# Patient Record
Sex: Male | Born: 1970
Health system: Southern US, Community
[De-identification: ages and names within clinical notes are randomized; demographics above are authoritative.]

## PROBLEM LIST (undated history)

## (undated) DIAGNOSIS — R918 Other nonspecific abnormal finding of lung field: Secondary | ICD-10-CM

## (undated) DIAGNOSIS — N200 Calculus of kidney: Secondary | ICD-10-CM

## (undated) DIAGNOSIS — K4020 Bilateral inguinal hernia, without obstruction or gangrene, not specified as recurrent: Secondary | ICD-10-CM

## (undated) DIAGNOSIS — N189 Chronic kidney disease, unspecified: Secondary | ICD-10-CM

## (undated) DIAGNOSIS — G43909 Migraine, unspecified, not intractable, without status migrainosus: Secondary | ICD-10-CM

## (undated) DIAGNOSIS — F17200 Nicotine dependence, unspecified, uncomplicated: Secondary | ICD-10-CM

## (undated) DIAGNOSIS — E782 Mixed hyperlipidemia: Secondary | ICD-10-CM

## (undated) HISTORY — DX: Bilateral inguinal hernia, without obstruction or gangrene, not specified as recurrent: K40.20

## (undated) HISTORY — DX: Migraine, unspecified, not intractable, without status migrainosus: G43.909

## (undated) HISTORY — DX: Chronic kidney disease, unspecified: N18.9

## (undated) HISTORY — PX: VASECTOMY: SHX75

## (undated) HISTORY — DX: Nicotine dependence, unspecified, uncomplicated: F17.200

## (undated) HISTORY — DX: Mixed hyperlipidemia: E78.2

## (undated) HISTORY — DX: Other nonspecific abnormal finding of lung field: R91.8

---

## 2013-10-23 ENCOUNTER — Encounter: Payer: Self-pay | Admitting: Urology

## 2016-07-12 ENCOUNTER — Emergency Department: Payer: BLUE CROSS/BLUE SHIELD

## 2016-07-12 ENCOUNTER — Emergency Department
Admission: EM | Admit: 2016-07-12 | Discharge: 2016-07-12 | Disposition: A | Discharge status: Home / Self Care | Payer: BLUE CROSS/BLUE SHIELD | Attending: Emergency Medicine | Admitting: Emergency Medicine

## 2016-07-12 ENCOUNTER — Encounter: Payer: Self-pay | Admitting: Emergency Medicine

## 2016-07-12 DIAGNOSIS — Z79899 Other long term (current) drug therapy: Secondary | ICD-10-CM | POA: Diagnosis not present

## 2016-07-12 DIAGNOSIS — M545 Low back pain, unspecified: Secondary | ICD-10-CM

## 2016-07-12 DIAGNOSIS — Z7982 Long term (current) use of aspirin: Secondary | ICD-10-CM | POA: Insufficient documentation

## 2016-07-12 DIAGNOSIS — F17200 Nicotine dependence, unspecified, uncomplicated: Secondary | ICD-10-CM | POA: Insufficient documentation

## 2016-07-12 DIAGNOSIS — R109 Unspecified abdominal pain: Secondary | ICD-10-CM | POA: Insufficient documentation

## 2016-07-12 HISTORY — DX: Calculus of kidney: N20.0

## 2016-07-12 LAB — URINALYSIS, ROUTINE W REFLEX MICROSCOPIC
Bacteria, UA: NONE SEEN
Bilirubin Urine: NEGATIVE
Glucose, UA: NEGATIVE mg/dL
Ketones, ur: NEGATIVE mg/dL
Leukocytes, UA: NEGATIVE
Nitrite: NEGATIVE
Protein, ur: NEGATIVE mg/dL
Specific Gravity, Urine: 1.014 (ref 1.005–1.030)
Squamous Epithelial / LPF: NONE SEEN
pH: 5 (ref 5.0–8.0)

## 2016-07-12 MED ORDER — KETOROLAC TROMETHAMINE 30 MG/ML IJ SOLN
15.0000 mg | Freq: Once | INTRAMUSCULAR | Status: AC
  Administered 2016-07-12: 15 mg via INTRAVENOUS
  Filled 2016-07-12: qty 1

## 2016-07-12 MED ORDER — HYDROCODONE-ACETAMINOPHEN 5-325 MG PO TABS: 1.0000 | ORAL_TABLET | ORAL | 0 refills | Status: DC | PRN

## 2016-07-12 MED ORDER — OXYCODONE-ACETAMINOPHEN 5-325 MG PO TABS
2.0000 | ORAL_TABLET | Freq: Once | ORAL | Status: AC
  Administered 2016-07-12: 2 via ORAL
  Filled 2016-07-12: qty 2

## 2016-07-12 MED ORDER — ONDANSETRON HCL 4 MG/2ML IJ SOLN
4.0000 mg | INTRAMUSCULAR | Status: AC
  Administered 2016-07-12: 4 mg via INTRAVENOUS
  Filled 2016-07-12: qty 2

## 2016-07-12 MED ORDER — SODIUM CHLORIDE 0.9 % IV BOLUS (SEPSIS)
1000.0000 mL | Freq: Once | INTRAVENOUS | Status: AC
  Administered 2016-07-12: 1000 mL via INTRAVENOUS

## 2016-07-12 MED ORDER — MORPHINE SULFATE (PF) 4 MG/ML IV SOLN
4.0000 mg | Freq: Once | INTRAVENOUS | Status: AC
Start: 2016-07-12 — End: 2016-07-12
  Administered 2016-07-12: 4 mg via INTRAVENOUS
  Filled 2016-07-12: qty 1

## 2016-07-12 NOTE — ED Provider Notes (Signed)
[facility] Emergency Department Provider Note  ____________________________________________   First MD Initiated Contact with Patient 07/12/16 1900     (approximate)  I have reviewed the triage vital signs and the nursing notes.   HISTORY  Chief Complaint Flank Pain    HPI Nathan Humphrey is a 46 y.o. male who reports a history of multiple prior episodes of kidney stones but none of which required surgical intervention who presents for episodic right flank pain over the last 2 weeks that has been worse over the last 24 hours.  He describes the pain as sharp and stabbing as well as a dull ache and feels similar to prior kidney stone episodes.  He denies dysuria and hematuria.  He has had some tick exposures recently but has had no rash, fever, chills, chest pain, shortness of breath, nausea, vomiting, abdominal pain.  He had some leftover doxycycline that he has been taking but it does not seem to have made a difference in terms of his flank pain.  Of note he is also started a new job recently with that requires lifting heavy objects and in addition to the right-sided flank pain he feels bilateral lower back pain.The patient reports that twisting side to side allegedly things makes the pain worse, nothing in particular makes it better.   Past Medical History:  Diagnosis Date  . Kidney stones     There are no active problems to display for this patient.   History reviewed. No pertinent surgical history.  Prior to Admission medications   Medication Sig Start Date End Date Taking? Authorizing Provider  acetaminophen (TYLENOL) 500 MG tablet Take 500 mg by mouth every 6 (six) hours as needed.   Yes [provider]  aspirin-acetaminophen-caffeine (EXCEDRIN MIGRAINE) [phone] MG tablet Take 1 tablet by mouth every 6 (six) hours as needed for headache.   Yes [provider]  doxycycline (VIBRAMYCIN) 100 MG capsule Take 100 mg by mouth 2 (two)  times daily. 07/10/16  Yes [provider]  HYDROcodone-acetaminophen (NORCO/VICODIN) 5-325 MG tablet Take 1-2 tablets by mouth every 4 (four) hours as needed for moderate pain. 07/12/16   Loleta RoseForbach, Taylour Lietzke, MD    Allergies Patient has no known allergies.  History reviewed. No pertinent family history.  Social History Social History  Substance Use Topics  . Smoking status: Current Some Day Smoker  . Smokeless tobacco: Never Used  . Alcohol use Yes    Review of Systems Constitutional: No fever/chills Eyes: No visual changes. ENT: No sore throat. Cardiovascular: Denies chest pain. Respiratory: Denies shortness of breath. Gastrointestinal: No abdominal pain.  No nausea, no vomiting.  No diarrhea.  No constipation. Genitourinary: Negative for dysuria. Musculoskeletal: Episodic right flank pain over the last 2 weeks with more acute onset bilateral lower back pain. Integumentary: Negative for rash. Neurological: Negative for headaches, focal weakness or numbness.   ____________________________________________   PHYSICAL EXAM:  VITAL SIGNS: ED Triage Vitals  Enc Vitals Group     BP 07/12/16 1845 130/77     Pulse Rate 07/12/16 1845 99     Resp 07/12/16 1845 18     Temp 07/12/16 1917 97.7 F (36.5 C)     Temp Source 07/12/16 1917 Oral     SpO2 07/12/16 1845 98 %     Weight 07/12/16 1845 88.5 kg (195 lb)     Height 07/12/16 1845 1.778 m (5\' 10" )     Head Circumference --      Peak Flow --  Pain Score 07/12/16 1844 9     Pain Loc --      Pain Edu? --      Excl. in GC? --     Constitutional: Alert and oriented. Well appearing and in no acute distress. Eyes: Conjunctivae are normal.  Head: Atraumatic. Nose: No congestion/rhinnorhea. Mouth/Throat: Mucous membranes are moist. Neck: No stridor.  No meningeal signs.   Cardiovascular: Normal rate, regular rhythm. Good peripheral circulation. Grossly normal heart sounds. Respiratory: Normal respiratory effort.  No  retractions. Lungs CTAB. Gastrointestinal: Soft and nontender. No distention.  Musculoskeletal: No specific CVA tenderness on either side but the patient has diffuse mild lower back tenderness to palpation of the muscles of his lower back.  No bony spinal tenderness to palpation.  No lower extremity tenderness nor edema. No gross deformities of extremities. Neurologic:  Normal speech and language. No gross focal neurologic deficits are appreciated.  Skin:  Skin is warm, dry and intact. No rash noted except for extensive recent sun exposure on his shoulders and upper back and he and his wife confirm they have been outside enjoying the weather recently. Psychiatric: Mood and affect are normal. Speech and behavior are normal.  ____________________________________________   LABS (all labs ordered are listed, but only abnormal results are displayed)  Labs Reviewed  URINALYSIS, ROUTINE W REFLEX MICROSCOPIC - Abnormal; Notable for the following:       Result Value   Color, Urine YELLOW (*)    APPearance CLEAR (*)    Hgb urine dipstick SMALL (*)    All other components within normal limits   ____________________________________________  EKG  None - EKG not ordered by ED physician ____________________________________________  RADIOLOGY   Ct Renal Stone Study  Result Date: 07/12/2016 CLINICAL DATA:  Patient with right flank pain for 2 weeks. EXAM: CT ABDOMEN AND PELVIS WITHOUT CONTRAST TECHNIQUE: Multidetector CT imaging of the abdomen and pelvis was performed following the standard protocol without IV contrast. COMPARISON:  CT abdomen pelvis 11/15/2013 FINDINGS: Lower chest: Normal heart size. Dependent atelectasis within the bilateral lower lobes. Stable 3 mm subpleural left lower lobe nodule (image 14; series 4) dating back to 11/15/2013, compatible with benign process. Additionally there is a 5 mm nodule within the lingula (image 4; series 4) and additional 5 mm nodule in the lingula (image  9; series 4). No pleural effusion. Hepatobiliary: Liver is diffusely low in attenuation compatible with steatosis. Gallbladder is unremarkable. Pancreas: Unremarkable Spleen: Unremarkable Adrenals/Urinary Tract: The adrenal glands are normal. Kidneys are symmetric in size. No hydronephrosis. 2 mm stone superior pole left kidney. 3 mm stone inferior pole left kidney. Scattered 2- 3 mm stones throughout the right kidney. No ureterolithiasis. Stomach/Bowel: No abnormal bowel wall thickening or evidence for bowel obstruction. Normal appendix. No free fluid or free intraperitoneal air. Normal morphology of the stomach. Small hiatal hernia. Vascular/Lymphatic: Normal caliber abdominal aorta. Peripheral calcified atherosclerotic plaque. No retroperitoneal lymphadenopathy. Reproductive: Central dystrophic calcifications in the prostate. Other: Bilateral fat containing inguinal hernias, right greater than left. Musculoskeletal: Lumbar spine degenerative changes. No aggressive or acute appearing osseous lesions. IMPRESSION: Bilateral nephrolithiasis.  No ureterolithiasis.  No hydronephrosis. Hepatic steatosis. No acute process within the abdomen or pelvis. Two adjacent 5 mm nodules within the lingula. No follow-up needed if patient is low-risk (and has no known or suspected primary neoplasm). Non-contrast chest CT can be considered in 12 months if patient is high-risk. This recommendation follows the consensus statement: Guidelines for Management of Incidental Pulmonary Nodules Detected on CT  Images: From the Fleischner Society 2017; Radiology 2017; [phone]. Electronically Signed   By: Annia Belt M.D.   On: 07/12/2016 19:46    ____________________________________________   PROCEDURES  Critical Care performed: No   Procedure(s) performed:   Procedures   ____________________________________________   INITIAL IMPRESSION / ASSESSMENT AND PLAN / ED COURSE  Pertinent labs & imaging results that were  available during my care of the patient were reviewed by me and considered in my medical decision making (see chart for details).  The patient is well-appearing and in no acute distress in spite of his report of persistent flank pain.  He has some hemoglobin in his urine but no obvious red cells and no sign of infection.  I obtained a renal stone protocol CT scan which demonstrated some nephrolithiasis but no ureterolithiasis or other evidence of acute or emergent medical condition.  We had a lengthy discussion about the possibility he has passed a stone which could be leading to some persistent discomfort or that his pain is simply the result of musculoskeletal strain from his new job.I reviewed the patient's prescription history over the last 12 months in the multi-state controlled substances database(s) that includes [place], [place], [place], [place], [place], [place], [place], [place], [place], [place], [place], [place], [place], and [place].  The patient has filled no controlled substances during that time except for a prescription for Hydromet syrup prescribed about 10 months ago.  I will give him a short course of Norco and encouraged him to take an NSAID and gave my usual and customary management recommendations and return precautions.      ____________________________________________  FINAL CLINICAL IMPRESSION(S) / ED DIAGNOSES  Final diagnoses:  Right flank pain  Acute bilateral low back pain without sciatica     MEDICATIONS GIVEN DURING THIS VISIT:  Medications  ketorolac (TORADOL) 30 MG/ML injection 15 mg (15 mg Intravenous Given 07/12/16 1908)  morphine 4 MG/ML injection 4 mg (4 mg Intravenous Given 07/12/16 1908)  ondansetron (ZOFRAN) injection 4 mg (4 mg Intravenous Given 07/12/16 1908)  sodium chloride 0.9 % bolus 1,000 mL (0 mLs Intravenous Stopped 07/12/16 2021)  oxyCODONE-acetaminophen (PERCOCET/ROXICET) 5-325 MG per tablet 2 tablet (2  tablets Oral Given 07/12/16 2021)     NEW OUTPATIENT MEDICATIONS STARTED DURING THIS VISIT:  Discharge Medication List as of 07/12/2016  8:14 PM    START taking these medications   Details  HYDROcodone-acetaminophen (NORCO/VICODIN) 5-325 MG tablet Take 1-2 tablets by mouth every 4 (four) hours as needed for moderate pain., Starting Sun 07/12/2016, Print        Discharge Medication List as of 07/12/2016  8:14 PM      Discharge Medication List as of 07/12/2016  8:14 PM       Note:  This document was prepared using Dragon voice recognition software and may include unintentional dictation errors.    Loleta Rose, MD 07/12/16 2043

## 2016-07-12 NOTE — ED Triage Notes (Signed)
Right flank pain x 2 weeks worse over the past day. C/o some dysuria earlier. Unable to sit still due to pain. Hx of kidney stones.

## 2016-07-12 NOTE — ED Notes (Signed)
Pt called out for pain medicine. Per MD York CeriseForbach, MD needs to assess before more medication given.

## 2016-07-12 NOTE — Progress Notes (Signed)
Pt requested to walk to CT.

## 2016-07-12 NOTE — Discharge Instructions (Signed)
You have been seen in the Emergency Department (ED) today for pain that we believe based on your workup, is caused by kidney stones.  It is also possible that you simply have muscle strain due to your new job.  Please drink plenty of fluids.  Please make a follow up appointment with the physician(s) listed elsewhere in this documentation.  You may take pain medication as needed but ONLY as prescribed.   We also recommend that you take over-the-counter ibuprofen regularly according to label instructions over the next 5 days.  Take it with meals to minimize stomach discomfort.  Please see your doctor as soon as possible as stones may take 1-3 weeks to pass and you may require additional care or medications.  Do not drink alcohol, drive or participate in any other potentially dangerous activities while taking opiate pain medication as it may make you sleepy. Do not take this medication with any other sedating medications, either prescription or over-the-counter. If you were prescribed Percocet or Vicodin, do not take these with acetaminophen (Tylenol) as it is already contained within these medications.   Take Norco as needed for severe pain.  This medication is an opiate (or narcotic) pain medication and can be habit forming.  Use it as little as possible to achieve adequate pain control.  Do not use or use it with extreme caution if you have a history of opiate abuse or dependence.  If you are on a pain contract with your primary care doctor or a pain specialist, be sure to let them know you were prescribed this medication today from the [facility].  This medication is intended for your use only - do not give any to anyone else and keep it in a secure place where nobody else, especially children, have access to it.  It will also cause or worsen constipation, so you may want to consider taking an over-the-counter stool softener while you are taking this medication.  Return to the  Emergency Department (ED) or call your doctor if you have any worsening pain, fever, painful urination, are unable to urinate, or develop other symptoms that concern you.

## 2016-07-17 DIAGNOSIS — R1084 Generalized abdominal pain: Secondary | ICD-10-CM | POA: Diagnosis not present

## 2016-07-24 ENCOUNTER — Encounter: Payer: Self-pay | Admitting: Internal Medicine

## 2016-07-24 ENCOUNTER — Ambulatory Visit (INDEPENDENT_AMBULATORY_CARE_PROVIDER_SITE_OTHER): Payer: BLUE CROSS/BLUE SHIELD | Admitting: Internal Medicine

## 2016-07-24 VITALS — BP 124/78 | HR 89 | Ht 70.0 in | Wt 205.0 lb

## 2016-07-24 DIAGNOSIS — K4020 Bilateral inguinal hernia, without obstruction or gangrene, not specified as recurrent: Secondary | ICD-10-CM | POA: Diagnosis not present

## 2016-07-24 DIAGNOSIS — N200 Calculus of kidney: Secondary | ICD-10-CM | POA: Insufficient documentation

## 2016-07-24 DIAGNOSIS — F17200 Nicotine dependence, unspecified, uncomplicated: Secondary | ICD-10-CM | POA: Diagnosis not present

## 2016-07-24 DIAGNOSIS — R918 Other nonspecific abnormal finding of lung field: Secondary | ICD-10-CM | POA: Insufficient documentation

## 2016-07-24 DIAGNOSIS — K625 Hemorrhage of anus and rectum: Secondary | ICD-10-CM

## 2016-07-24 HISTORY — DX: Other nonspecific abnormal finding of lung field: R91.8

## 2016-07-24 HISTORY — DX: Hemorrhage of anus and rectum: K62.5

## 2016-07-24 HISTORY — DX: Nicotine dependence, unspecified, uncomplicated: F17.200

## 2016-07-24 HISTORY — DX: Calculus of kidney: N20.0

## 2016-07-24 HISTORY — DX: Bilateral inguinal hernia, without obstruction or gangrene, not specified as recurrent: K40.20

## 2016-07-24 NOTE — Patient Instructions (Signed)
Steps to Quit Smoking Smoking tobacco can be bad for your health. It can also affect almost every organ in your body. Smoking puts you and people around you at risk for many serious long-lasting (chronic) diseases. Quitting smoking is hard, but it is one of the best things that you can do for your health. It is never too late to quit. What are the benefits of quitting smoking? When you quit smoking, you lower your risk for getting serious diseases and conditions. They can include:  Lung cancer or lung disease.  Heart disease.  Stroke.  Heart attack.  Not being able to have children (infertility).  Weak bones (osteoporosis) and broken bones (fractures).  If you have coughing, wheezing, and shortness of breath, those symptoms may get better when you quit. You may also get sick less often. If you are pregnant, quitting smoking can help to lower your chances of having a baby of low birth weight. What can I do to help me quit smoking? Talk with your doctor about what can help you quit smoking. Some things you can do (strategies) include:  Quitting smoking totally, instead of slowly cutting back how much you smoke over a period of time.  Going to in-person counseling. You are more likely to quit if you go to many counseling sessions.  Using resources and support systems, such as: ? Online chats with a counselor. ? Phone quitlines. ? Printed self-help materials. ? Support groups or group counseling. ? Text messaging programs. ? Mobile phone apps or applications.  Taking medicines. Some of these medicines may have nicotine in them. If you are pregnant or breastfeeding, do not take any medicines to quit smoking unless your doctor says it is okay. Talk with your doctor about counseling or other things that can help you.  Talk with your doctor about using more than one strategy at the same time, such as taking medicines while you are also going to in-person counseling. This can help make  quitting easier. What things can I do to make it easier to quit? Quitting smoking might feel very hard at first, but there is a lot that you can do to make it easier. Take these steps:  Talk to your family and friends. Ask them to support and encourage you.  Call phone quitlines, reach out to support groups, or work with a counselor.  Ask people who smoke to not smoke around you.  Avoid places that make you want (trigger) to smoke, such as: ? Bars. ? Parties. ? Smoke-break areas at work.  Spend time with people who do not smoke.  Lower the stress in your life. Stress can make you want to smoke. Try these things to help your stress: ? Getting regular exercise. ? Deep-breathing exercises. ? Yoga. ? Meditating. ? Doing a body scan. To do this, close your eyes, focus on one area of your body at a time from head to toe, and notice which parts of your body are tense. Try to relax the muscles in those areas.  Download or buy apps on your mobile phone or tablet that can help you stick to your quit plan. There are many free apps, such as QuitGuide from the CDC (Centers for Disease Control and Prevention). You can find more support from smokefree.gov and other websites.  This information is not intended to replace advice given to you by your health care provider. Make sure you discuss any questions you have with your health care provider. Document Released: 11/22/2008 Document   Revised: 09/24/2015 Document Reviewed: 06/12/2014 Elsevier Interactive Patient Education  2018 Elsevier Inc.  

## 2016-07-24 NOTE — Progress Notes (Signed)
Date:  07/24/2016   Name:  Nathan Humphrey   DOB:  1970/10/19   MRN:  [id number]   Chief Complaint: [place] (New to area- From [place]. Never had PCP and needs one. ) HPI Bilateral fat containing inguinal hernias noted on CT done in ER for kidney stones. He has lower abdominal discomfort but no testicular pain.  Lung nodules - stable 3 mm nodule left lung noted on CT but 2 new 5 mm nodules seen in lingula.  Pt smokes cigars - usually 2-3 per weekend day.  No cough, shortness of breath or sputum production.  Kidney stones - seen by ED and noted to have tiny bilateral stones with no evidence of hydronephrosis.  Currently no flank pain.  He does have generalized lower abdominal discomfort.  Rectal bleeding - intermittent bleeding noted after defecation.  No pain, no constipation, no rectal mass.  No hx of colonoscopy.  Review of Systems  Constitutional: Negative for chills, fatigue and fever.  HENT: Negative for trouble swallowing.   Eyes: Negative for visual disturbance.  Respiratory: Negative for cough, chest tightness, shortness of breath and wheezing.   Cardiovascular: Negative for chest pain, palpitations and leg swelling.  Gastrointestinal: Positive for abdominal pain and blood in stool (this week). Negative for constipation, nausea, rectal pain and vomiting.  Endocrine: Negative for polydipsia.  Musculoskeletal: Negative for arthralgias.  Skin: Negative for rash.  Neurological: Negative for dizziness and headaches.  Hematological: Negative for adenopathy.  Psychiatric/Behavioral: Negative for sleep disturbance.    Patient Active Problem List   Diagnosis Date Noted  . Pulmonary nodules/lesions, multiple 07/24/2016  . Bilateral inguinal hernia without obstruction or gangrene 07/24/2016  . Kidney stones 07/24/2016  . Tobacco use disorder 07/24/2016    Prior to Admission medications   Medication Sig Start Date End Date Taking? Authorizing Provider  acetaminophen  (TYLENOL) 500 MG tablet Take 500 mg by mouth every 6 (six) hours as needed.   Yes [provider]  aspirin-acetaminophen-caffeine (EXCEDRIN MIGRAINE) [phone] MG tablet Take 1 tablet by mouth every 6 (six) hours as needed for headache.   Yes [provider]  HYDROcodone-acetaminophen (NORCO/VICODIN) 5-325 MG tablet Take 1-2 tablets by mouth every 4 (four) hours as needed for moderate pain. 07/12/16  Yes Hinda Kehr, MD    No Known Allergies  History reviewed. No pertinent surgical history.  Social History  Substance Use Topics  . Smoking status: Current Some Day Smoker    Types: Cigarettes  . Smokeless tobacco: Never Used     Comment: smokes on weekend.   . Alcohol use Yes     Comment: occasional.   Component Name  IP LAB RESULTS on 10/18/2013 [facility]")' href="[email or web address]/">10/18/2013   137  View Detailed Result Report COMP METABOLIC PANEL 10/13/5036")' href="[id number] COMP METABOLIC MBEML/">5.4  492  30  117 (H)  16  View Detailed Result Report COMP METABOLIC PANEL 0/02/69")' href="[id number] COMP METABOLIC RPRXY/">5.85 (H)  9.5  8.1  4.2  154 (H)  View Detailed Result Report COMP METABOLIC PANEL 10/12/9242")' href="[id number] COMP METABOLIC OKHTX/">77  0.8  3.9  1.1  57  >59  52 (L)  View Detailed Result Report COMP METABOLIC PANEL 05/10/4237")' href="[id number] COMP METABOLIC PANEL/"> VV/KPQ/[id number] FAILURE:")'>GFR IS ESTI...  6.1 (L)  89  Sodium(Na)  Potassium(K)  Chloride(Cl)  Carbon Dioxide(CO2)  Glucose  Urea Nitrogen  Creatinine  Calcium(Ca)  Total Protein  Albumin  Alkaline Phosphatase  Aspartate Aminot.(AST)  Total  Bilirubin  GLOBULIN  A/G RATIO  Alanine Aminotrans(ALT)  EGFR AFRICAN AMERICAN  EGFR NON AFRICAN AMERICAN  GFR COMMENT  ANION GAP  Lipase    Component Name  IP LAB RESULTS on 10/18/2013 [facility]")' href="[email or web address]/">10/18/2013   10.1  5.26  16.5  48.5  92.1  31.4  34.1  13.2  163  10.1  70.5  19.1  7.9  1.9  0.6  7.1  1.9  0.8  0.2  0.1  View Detailed Result Report PROTHROMBIN TIME/INR 10/18/2013")' href="[id number] PROTHROMBIN TIME/INR">13.4  View Detailed Result Report PROTHROMBIN TIME/INR 10/18/2013")' href="[id number] PROTHROMBIN TIME/INR">1.0  WBC  RBC  Hgb  Hematocrit(HCT)  MCV  MCH  MCHC  RDW  Platelets  Mean Platelet Volume  Neutrophils  Lymphocytes  Monocytes  Eosinophils  Basophils  ABS Neutrophils  ABS Lymphocytes  ABS Monocytes  ABS Eosinophils  ABS Basophils  Prothrombin Time  INR    Medication list has been reviewed and updated.   Physical Exam  Constitutional: He is oriented to person, place, and time. He appears well-developed. No distress.  HENT:  Head: Normocephalic and atraumatic.  Neck: Normal range of motion. Neck supple. No thyromegaly present.  Cardiovascular: Normal rate, regular rhythm and normal heart sounds.   Pulmonary/Chest: Effort normal and breath sounds normal. No respiratory distress. He has no wheezes.  Abdominal: Soft. Normal appearance and bowel sounds are normal. There is generalized tenderness. There is no rigidity, no guarding and no CVA tenderness. A hernia (tender bilateral groin) is present.  Genitourinary: Rectal exam shows external hemorrhoid (tiny hemorrhoid at 12 oclock) and tenderness. Rectal exam shows guaiac negative stool.  Musculoskeletal: Normal range of motion.  Neurological: He is alert and  oriented to person, place, and time.  Skin: Skin is warm and dry. No rash noted.  Psychiatric: He has a normal mood and affect. His speech is normal and behavior is normal. Thought content normal.  Nursing note and vitals reviewed.   BP 124/78   Pulse 89   Ht _0  (1.778 m)   Wt 205 lb (93 kg)   SpO2 98%   BMI 29.41 kg/m   Assessment and Plan: 1. Pulmonary nodules/lesions, multiple Need CT in one year Pt counseled on smoking cessation  2. Bilateral inguinal hernia without obstruction or gangrene, recurrence not specified Refer to General Surgery - Ambulatory referral to General Surgery  3. Kidney stones asx currently  4. Tobacco use disorder To work on quitting  5. Bright red rectal bleeding Guaiac negative today but likely from small hemorrhoid Return for further evaluation if persistent   No orders of the defined types were placed in this encounter.   Halina Maidens, MD [facility]  07/24/2016

## 2016-07-31 ENCOUNTER — Encounter: Payer: Self-pay | Admitting: Internal Medicine

## 2016-07-31 ENCOUNTER — Ambulatory Visit (INDEPENDENT_AMBULATORY_CARE_PROVIDER_SITE_OTHER): Payer: BLUE CROSS/BLUE SHIELD | Admitting: Internal Medicine

## 2016-07-31 VITALS — BP 118/76 | HR 82 | Ht 70.0 in | Wt 204.0 lb

## 2016-07-31 DIAGNOSIS — R918 Other nonspecific abnormal finding of lung field: Secondary | ICD-10-CM | POA: Diagnosis not present

## 2016-07-31 DIAGNOSIS — F17200 Nicotine dependence, unspecified, uncomplicated: Secondary | ICD-10-CM

## 2016-07-31 DIAGNOSIS — K4020 Bilateral inguinal hernia, without obstruction or gangrene, not specified as recurrent: Secondary | ICD-10-CM

## 2016-07-31 DIAGNOSIS — Z0000 Encounter for general adult medical examination without abnormal findings: Secondary | ICD-10-CM | POA: Diagnosis not present

## 2016-07-31 LAB — POCT URINALYSIS DIPSTICK
Bilirubin, UA: NEGATIVE
Blood, UA: NEGATIVE
Glucose, UA: NEGATIVE
Ketones, UA: NEGATIVE
Nitrite, UA: NEGATIVE
Protein, UA: NEGATIVE
Spec Grav, UA: <=1.005 — AB (ref 1.010–1.025)
Urobilinogen, UA: 0.2 E.U./dL
pH, UA: 5 (ref 5.0–8.0)

## 2016-07-31 NOTE — Progress Notes (Signed)
Date:  07/31/2016   Name:  Nathan Humphrey   DOB:  1970-09-02   MRN:  [id number]   Chief Complaint: Annual Exam Nathan Humphrey is a 46 y.o. male who presents today for his Complete Annual Exam. He feels well. He reports exercising regularly with work. He reports he is sleeping well.  He has has no further rectal bleeding - was checked last week with no obvious cause. He still has abdominal tenderness - either related to kidney stones or his bilateral inguinal hernias.  He is waiting for a General Surgery appt to discuss surgery. He is cutting back on cigars.  He mostly smokes on the weekend so does not have much work to do.   Review of Systems  Constitutional: Negative for appetite change, chills, diaphoresis, fatigue and unexpected weight change.  HENT: Negative for hearing loss, tinnitus, trouble swallowing and voice change.   Eyes: Negative for visual disturbance.  Respiratory: Negative for choking, shortness of breath and wheezing.   Cardiovascular: Negative for chest pain, palpitations and leg swelling.  Gastrointestinal: Positive for abdominal pain. Negative for blood in stool, constipation, diarrhea, rectal pain and vomiting.  Genitourinary: Negative for difficulty urinating, dysuria and frequency.  Musculoskeletal: Negative for arthralgias, back pain and myalgias.  Skin: Negative for color change and rash.  Neurological: Negative for dizziness, syncope and headaches.  Hematological: Negative for adenopathy.  Psychiatric/Behavioral: Negative for dysphoric mood and sleep disturbance.    Patient Active Problem List   Diagnosis Date Noted  . Pulmonary nodules/lesions, multiple 07/24/2016  . Bilateral inguinal hernia without obstruction or gangrene 07/24/2016  . Kidney stones 07/24/2016  . Tobacco use disorder 07/24/2016  . Bright red rectal bleeding 07/24/2016    Prior to Admission medications   Medication Sig Start Date End Date Taking? Authorizing Provider  acetaminophen  (TYLENOL) 500 MG tablet Take 500 mg by mouth every 6 (six) hours as needed.   Yes [provider]  aspirin-acetaminophen-caffeine (EXCEDRIN MIGRAINE) [phone] MG tablet Take 1 tablet by mouth every 6 (six) hours as needed for headache.   Yes [provider]  HYDROcodone-acetaminophen (NORCO/VICODIN) 5-325 MG tablet Take 1-2 tablets by mouth every 4 (four) hours as needed for moderate pain. 07/12/16  Yes Loleta Rose, MD    No Known Allergies  History reviewed. No pertinent surgical history.  Social History  Substance Use Topics  . Smoking status: Current Some Day Smoker    Types: Cigars  . Smokeless tobacco: Never Used     Comment: 2-3 cigars per day only on weekends (inhaled)  . Alcohol use Yes     Comment: occasional.     Medication list has been reviewed and updated.   Physical Exam  Constitutional: He is oriented to person, place, and time. He appears well-developed. No distress.  HENT:  Head: Normocephalic and atraumatic.  Neck: Normal range of motion. Neck supple. No thyromegaly present.  Cardiovascular: Normal rate, regular rhythm and normal heart sounds.   Pulmonary/Chest: Effort normal and breath sounds normal. No respiratory distress. He has no wheezes.  Abdominal: Soft. Normal appearance. There is generalized tenderness. There is no rigidity, no guarding and no CVA tenderness.  Musculoskeletal: Normal range of motion.  Neurological: He is alert and oriented to person, place, and time. He has normal strength and normal reflexes.  Skin: Skin is warm and dry. No rash noted.  Psychiatric: He has a normal mood and affect. His speech is normal and behavior is normal. Thought content normal.  Nursing note and vitals reviewed.   BP 134/80   Pulse 82   Ht 5\' 10"  (1.778 m)   Wt 204 lb (92.5 kg)   SpO2 99%   BMI 29.27 kg/m   Assessment and Plan: 1. Annual physical exam - Lipid panel - Comprehensive metabolic panel - CBC with  Differential/Platelet - POCT urinalysis dipstick  2. Bilateral inguinal hernia without obstruction or gangrene, recurrence not specified Waiting for General surgery referral  3. Tobacco use disorder Counseled to quit  4. Pulmonary nodules/lesions, multiple Repeat CT chest in one year   No orders of the defined types were placed in this encounter.   Bari EdwardLaura Corrinna Karapetyan, MD [facility]  07/31/2016

## 2016-07-31 NOTE — Patient Instructions (Signed)

## 2016-08-01 LAB — CBC WITH DIFFERENTIAL/PLATELET
Basophils Absolute: 0.1 10*3/uL (ref 0.0–0.2)
Basos: 1 %
EOS (ABSOLUTE): 0.2 10*3/uL (ref 0.0–0.4)
Eos: 3 %
Hematocrit: 45 % (ref 37.5–51.0)
Hemoglobin: 15.2 g/dL (ref 13.0–17.7)
Immature Grans (Abs): 0 10*3/uL (ref 0.0–0.1)
Immature Granulocytes: 0 %
Lymphocytes Absolute: 2.7 10*3/uL (ref 0.7–3.1)
Lymphs: 34 %
MCH: 31.5 pg (ref 26.6–33.0)
MCHC: 33.8 g/dL (ref 31.5–35.7)
MCV: 93 fL (ref 79–97)
Monocytes Absolute: 0.6 10*3/uL (ref 0.1–0.9)
Monocytes: 7 %
Neutrophils Absolute: 4.5 10*3/uL (ref 1.4–7.0)
Neutrophils: 55 %
Platelets: 184 10*3/uL (ref 150–379)
RBC: 4.82 x10E6/uL (ref 4.14–5.80)
RDW: 13.8 % (ref 12.3–15.4)
WBC: 8.1 10*3/uL (ref 3.4–10.8)

## 2016-08-01 LAB — COMPREHENSIVE METABOLIC PANEL
ALT: 38 IU/L (ref 0–44)
AST: 27 IU/L (ref 0–40)
Albumin/Globulin Ratio: 1.8 (ref 1.2–2.2)
Albumin: 4.4 g/dL (ref 3.5–5.5)
Alkaline Phosphatase: 118 IU/L — ABNORMAL HIGH (ref 39–117)
BUN/Creatinine Ratio: 11 (ref 9–20)
BUN: 12 mg/dL (ref 6–24)
Bilirubin Total: 0.5 mg/dL (ref 0.0–1.2)
CO2: 25 mmol/L (ref 20–29)
Calcium: 9.8 mg/dL (ref 8.7–10.2)
Chloride: 103 mmol/L (ref 96–106)
Creatinine, Ser: 1.1 mg/dL (ref 0.76–1.27)
GFR calc Af Amer: 95 mL/min/{1.73_m2} (ref >59)
GFR calc non Af Amer: 82 mL/min/{1.73_m2} (ref >59)
Globulin, Total: 2.4 g/dL (ref 1.5–4.5)
Glucose: 97 mg/dL (ref 65–99)
Potassium: 4.4 mmol/L (ref 3.5–5.2)
Sodium: 144 mmol/L (ref 134–144)
Total Protein: 6.8 g/dL (ref 6.0–8.5)

## 2016-08-01 LAB — LIPID PANEL
Chol/HDL Ratio: 5.4 ratio — ABNORMAL HIGH (ref 0.0–5.0)
Cholesterol, Total: 210 mg/dL — ABNORMAL HIGH (ref 100–199)
HDL: 39 mg/dL — ABNORMAL LOW (ref >39)
LDL Calculated: 139 mg/dL — ABNORMAL HIGH (ref 0–99)
Triglycerides: 162 mg/dL — ABNORMAL HIGH (ref 0–149)
VLDL Cholesterol Cal: 32 mg/dL (ref 5–40)

## 2016-08-03 ENCOUNTER — Encounter: Payer: Self-pay | Admitting: Internal Medicine

## 2016-08-03 ENCOUNTER — Telehealth: Payer: Self-pay | Admitting: Surgery

## 2016-08-03 DIAGNOSIS — E782 Mixed hyperlipidemia: Secondary | ICD-10-CM | POA: Insufficient documentation

## 2016-08-03 HISTORY — DX: Mixed hyperlipidemia: E78.2

## 2016-08-03 NOTE — Telephone Encounter (Signed)
I have called patient to make an appointment with surgeon per referral request from Dr Asencion PartridgeBergland. Inguinal Hernia. No answer. I have left a detailed message for patient to call and make appointment.

## 2016-08-03 NOTE — Progress Notes (Signed)
Informed pt of cholesterol being borderline elevated. Told medication is recommended. Patient stated he wants to think about it and will call back in a few days to let us know what he decides.

## 2016-08-04 NOTE — Telephone Encounter (Signed)
Appointment has been made with Dr Aleen CampiPiscoya.

## 2016-08-13 ENCOUNTER — Encounter: Payer: Self-pay | Admitting: Surgery

## 2016-08-13 ENCOUNTER — Ambulatory Visit (INDEPENDENT_AMBULATORY_CARE_PROVIDER_SITE_OTHER): Payer: BLUE CROSS/BLUE SHIELD | Admitting: Surgery

## 2016-08-13 VITALS — BP 133/88 | HR 106 | Temp 98.6°F | Ht 70.0 in | Wt 203.0 lb

## 2016-08-13 DIAGNOSIS — K4020 Bilateral inguinal hernia, without obstruction or gangrene, not specified as recurrent: Secondary | ICD-10-CM | POA: Diagnosis not present

## 2016-08-13 NOTE — Patient Instructions (Signed)
Angie will call you with the cost of your surgery.   Please let us know if you develop any symptoms or if you choose to have your surgery.

## 2016-08-14 NOTE — Progress Notes (Signed)
08/14/2016  Reason for Visit:  Bilateral inguinal hernias  History of Present Illness: Nathan Humphrey is a 46 y.o. male referred by his PCP for evaluation of bilateral inguinal hernias.  The patient has a history of kidney stones and was in the emergency room on 07/12/16 with right flank pain.  CT scan showed that he had small bilateral inguinal hernias containing fat.    The patient denies any significant groin symptoms.  Reports more discomfort in the mid abdomen on either side rather than low abdomen and groin area.  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  Denies any nausea, vomiting, changes in bowel habits, blood in the stool.  Reports he has not really noticed any bulging on either side either.   Past Medical History: Past Medical History:  Diagnosis Date  . Bilateral inguinal hernia   . Bilateral inguinal hernia without obstruction or gangrene 07/24/2016  . Chronic kidney disease    kidney stones  . Hyperlipidemia, mixed 08/03/2016   10 yr risk 7.5 %  . Kidney stones 07/24/2016   Has had several episodes of stones  . Migraines   . Pulmonary nodules/lesions, multiple 07/24/2016   CT scan in one year to follow up 2 new nodules in Lingula (5 mm in size)  . Tobacco use disorder 07/24/2016     Past Surgical History: --None  Home Medications: Prior to Admission medications   Medication Sig Start Date End Date Taking? Authorizing Provider  acetaminophen (TYLENOL) 500 MG tablet Take 500 mg by mouth every 6 (six) hours as needed.   Yes [provider]  aspirin-acetaminophen-caffeine (EXCEDRIN MIGRAINE) [phone] MG tablet Take 1 tablet by mouth every 6 (six) hours as needed for headache.   Yes [provider]  HYDROcodone-acetaminophen (NORCO/VICODIN) 5-325 MG tablet Take 1-2 tablets by mouth every 4 (four) hours as needed for moderate pain. 07/12/16  Yes Loleta RoseForbach, Cory,  MD    Allergies: No Known Allergies  Social History:  reports that he has been smoking Cigars.  He has never used smokeless tobacco. He reports that he drinks alcohol. He reports that he does not use drugs.   Family History: Family History  Problem Relation Age of Onset  . CAD Mother 5261    Review of Systems: Review of Systems  Constitutional: Negative for chills and fever.  HENT: Negative for hearing loss.   Eyes: Negative for blurred vision.  Respiratory: Negative for cough.   Cardiovascular: Negative for chest pain.  Gastrointestinal: Negative for abdominal pain, blood in stool, nausea and vomiting.  Genitourinary: Positive for flank pain.  Musculoskeletal: Negative for myalgias.  Skin: Negative for rash.  Neurological: Negative for dizziness.  Psychiatric/Behavioral: Negative for depression.  All other systems reviewed and are negative.   Physical Exam BP 133/88   Pulse (!) 106   Temp 98.6 F (37 C) (Oral)   Ht 5\' 10"  (1.778 m)   Wt 92.1 kg (203 lb)   BMI 29.13 kg/m  CONSTITUTIONAL: No acute distress HEENT:  Normocephalic, atraumatic, extraocular motion intact. NECK: Trachea is midline, and there is no jugular venous distension.  RESPIRATORY:  Lungs are clear, and breath sounds are equal bilaterally. Normal respiratory effort without pathologic use of accessory muscles. CARDIOVASCULAR: Heart is regular without murmurs, gallops, or rubs. GI: The abdomen is soft, nondistended, nontender to palpation.  Mild swelling of bilateral groin area, without any  significant tenderness on exam.  Unable to fully palpate hernia defects, with no significant bulging with coughing. MUSCULOSKELETAL:  Normal muscle strength and tone in all four extremities.  No peripheral edema or cyanosis. SKIN: Skin turgor is normal. There are no pathologic skin lesions.  NEUROLOGIC:  Motor and sensation is grossly normal.  Cranial nerves are grossly intact. PSYCH:  Alert and oriented to person,  place and time. Affect is normal.  Laboratory Analysis: No results found for this or any previous visit (from the past 24 hour(s)).  Imaging: CT 07/12/16:  Bilateral fat containing inguinal hernias, right greater than left.   Assessment and Plan: This is a 46 y.o. male who presents with what appears to be asymptomatic bilateral inguinal hernias.  I have independently viewed the patient's imaging study and discussed it with the patient.  His CT scan shows small bilateral inguinal hernias, with fat only.  No evidence of bowel obstruction.  Discussed with the patient that there is no urgency to having any surgical repair of his hernias, given that he cannot really pinpoint any specific symptoms that he has had related to the hernias and he was not even aware of them until the CT scan was done.  For the meantime, we will plan for conservative management with watchful waiting.  Discussed with the patient possible symptoms to look out for and also discussed the array of inguinal hernias from reducible to strangulated.  The patient does think he'd be interested in surgery in the future, and will look into finances to better plan for this as needed.  He will call us with any questions or concerns and for possible surgical planning.  Face-to-face time spent with the patient and care providers was 45 minutes, with more than 50% of the time spent counseling, educating, and coordinating care of the patient.     Howie Ill, MD [facility]

## 2016-08-18 ENCOUNTER — Telehealth: Payer: Self-pay

## 2016-08-18 NOTE — Telephone Encounter (Signed)
-----   Message from Nicole KindredAngela K Brouillard sent at 08/14/2016 11:44 AM EDT ----- Regarding: RE: Surgery question I have called patient and discussed estimate. Pt will call back once he decided to schudule.  ----- Message ----- From: Adela PortsBoniche, Lucas Exline, CMA Sent: 08/13/2016   4:25 PM To: Karna ChristmasAngela K Brouillard Subject: Surgery question                               Please call patient with the cost of his surgery. With this info. He will determine if and when he will have it done.  It's Dr. Adelene IdlerPiscoya's patient.

## 2016-08-18 NOTE — Telephone Encounter (Signed)
Patient will call us back once he decides when to have his surgery.

## 2017-01-26 ENCOUNTER — Encounter: Payer: BLUE CROSS/BLUE SHIELD | Admitting: Internal Medicine

## 2017-01-29 ENCOUNTER — Encounter: Payer: BLUE CROSS/BLUE SHIELD | Admitting: Internal Medicine

## 2018-07-15 ENCOUNTER — Emergency Department
Admission: EM | Admit: 2018-07-15 | Discharge: 2018-07-15 | Disposition: A | Discharge status: Home / Self Care | Payer: 59 | Attending: Emergency Medicine | Admitting: Emergency Medicine

## 2018-07-15 ENCOUNTER — Emergency Department: Payer: 59

## 2018-07-15 DIAGNOSIS — N189 Chronic kidney disease, unspecified: Secondary | ICD-10-CM | POA: Insufficient documentation

## 2018-07-15 DIAGNOSIS — N130 Hydronephrosis with ureteropelvic junction obstruction: Secondary | ICD-10-CM | POA: Insufficient documentation

## 2018-07-15 DIAGNOSIS — F17290 Nicotine dependence, other tobacco product, uncomplicated: Secondary | ICD-10-CM | POA: Diagnosis not present

## 2018-07-15 DIAGNOSIS — R109 Unspecified abdominal pain: Secondary | ICD-10-CM | POA: Diagnosis present

## 2018-07-15 DIAGNOSIS — N23 Unspecified renal colic: Secondary | ICD-10-CM

## 2018-07-15 LAB — URINALYSIS, COMPLETE (UACMP) WITH MICROSCOPIC
RBC / HPF: >50 RBC/hpf — ABNORMAL HIGH (ref 0–5)
Specific Gravity, Urine: 1.032 — ABNORMAL HIGH (ref 1.005–1.030)
WBC, UA: >50 WBC/hpf — ABNORMAL HIGH (ref 0–5)

## 2018-07-15 LAB — CBC
HCT: 46.7 % (ref 39.0–52.0)
Hemoglobin: 15.8 g/dL (ref 13.0–17.0)
MCH: 31.3 pg (ref 26.0–34.0)
MCHC: 33.8 g/dL (ref 30.0–36.0)
MCV: 92.7 fL (ref 80.0–100.0)
Platelets: 208 10*3/uL (ref 150–400)
RBC: 5.04 MIL/uL (ref 4.22–5.81)
RDW: 12.7 % (ref 11.5–15.5)
WBC: 10.6 10*3/uL — ABNORMAL HIGH (ref 4.0–10.5)
nRBC: 0 % (ref 0.0–0.2)

## 2018-07-15 LAB — BASIC METABOLIC PANEL
Anion gap: 7 (ref 5–15)
BUN: 17 mg/dL (ref 6–20)
CO2: 28 mmol/L (ref 22–32)
Calcium: 9.3 mg/dL (ref 8.9–10.3)
Chloride: 107 mmol/L (ref 98–111)
Creatinine, Ser: 1.2 mg/dL (ref 0.61–1.24)
GFR calc Af Amer: >60 mL/min (ref >60)
GFR calc non Af Amer: >60 mL/min (ref >60)
Glucose, Bld: 104 mg/dL — ABNORMAL HIGH (ref 70–99)
Potassium: 4.2 mmol/L (ref 3.5–5.1)
Sodium: 142 mmol/L (ref 135–145)

## 2018-07-15 MED ORDER — SODIUM CHLORIDE 0.9 % IV SOLN
1.0000 g | Freq: Once | INTRAVENOUS | Status: AC
  Administered 2018-07-15: 1 g via INTRAVENOUS
  Filled 2018-07-15: qty 10

## 2018-07-15 MED ORDER — MORPHINE SULFATE (PF) 4 MG/ML IV SOLN
4.0000 mg | Freq: Once | INTRAVENOUS | Status: AC
  Administered 2018-07-15: 4 mg via INTRAVENOUS

## 2018-07-15 MED ORDER — TAMSULOSIN HCL 0.4 MG PO CAPS: 0.4000 mg | ORAL_CAPSULE | Freq: Every day | ORAL | 0 refills | Status: DC

## 2018-07-15 MED ORDER — ONDANSETRON 4 MG PO TBDP: 4.0000 mg | ORAL_TABLET | Freq: Three times a day (TID) | ORAL | 0 refills | Status: DC | PRN

## 2018-07-15 MED ORDER — CEPHALEXIN 500 MG PO CAPS
500.0000 mg | ORAL_CAPSULE | Freq: Four times a day (QID) | ORAL | 0 refills | Status: AC
Start: 2018-07-15 — End: 2018-07-25

## 2018-07-15 MED ORDER — KETOROLAC TROMETHAMINE 30 MG/ML IJ SOLN
30.0000 mg | Freq: Once | INTRAMUSCULAR | Status: AC
  Administered 2018-07-15: 30 mg via INTRAVENOUS
  Filled 2018-07-15: qty 1

## 2018-07-15 MED ORDER — ONDANSETRON HCL 4 MG/2ML IJ SOLN
INTRAMUSCULAR | Status: AC
  Administered 2018-07-15: 4 mg via INTRAVENOUS
  Filled 2018-07-15: qty 2

## 2018-07-15 MED ORDER — ONDANSETRON HCL 4 MG/2ML IJ SOLN
4.0000 mg | Freq: Once | INTRAMUSCULAR | Status: AC
  Administered 2018-07-15: 4 mg via INTRAVENOUS

## 2018-07-15 MED ORDER — OXYCODONE-ACETAMINOPHEN 5-325 MG PO TABS: 1.0000 | ORAL_TABLET | Freq: Three times a day (TID) | ORAL | 0 refills | Status: DC | PRN

## 2018-07-15 MED ORDER — OXYCODONE-ACETAMINOPHEN 5-325 MG PO TABS
2.0000 | ORAL_TABLET | Freq: Once | ORAL | Status: AC
  Administered 2018-07-15: 2 via ORAL
  Filled 2018-07-15: qty 2

## 2018-07-15 MED ORDER — MORPHINE SULFATE (PF) 4 MG/ML IV SOLN
INTRAVENOUS | Status: AC
  Administered 2018-07-15: 4 mg via INTRAVENOUS
  Filled 2018-07-15: qty 1

## 2018-07-15 NOTE — ED Provider Notes (Signed)
[facility] Emergency Department Provider Note       Time seen: ----------------------------------------- 7:46 AM on 07/15/2018 -----------------------------------------   I have reviewed the triage vital signs and the nursing notes.  HISTORY   Chief Complaint Flank Pain    HPI Nathan Humphrey is a 48 y.o. male with a history of bilateral inguinal hernia, kidney stones, chronic kidney disease, hyperlipidemia, migraines who presents to the ED for right flank pain that began immediately prior to arrival.  Pain is 9 out of 10, acutely in the right flank.  It is constant.  Past Medical History:  Diagnosis Date  . Bilateral inguinal hernia   . Bilateral inguinal hernia without obstruction or gangrene 07/24/2016  . Chronic kidney disease    kidney stones  . Hyperlipidemia, mixed 08/03/2016   10 yr risk 7.5 %  . Kidney stones 07/24/2016   Has had several episodes of stones  . Migraines   . Pulmonary nodules/lesions, multiple 07/24/2016   CT scan in one year to follow up 2 new nodules in Lingula (5 mm in size)  . Tobacco use disorder 07/24/2016    Patient Active Problem List   Diagnosis Date Noted  . Hyperlipidemia, mixed 08/03/2016  . Pulmonary nodules/lesions, multiple 07/24/2016  . Bilateral inguinal hernia without obstruction or gangrene 07/24/2016  . Kidney stones 07/24/2016  . Tobacco use disorder 07/24/2016  . Bright red rectal bleeding 07/24/2016    History reviewed. No pertinent surgical history.  Allergies Patient has no known allergies.  Social History Social History   Tobacco Use  . Smoking status: Current Some Day Smoker    Types: Cigars  . Smokeless tobacco: Never Used  . Tobacco comment: 2-3 cigars per day only on weekends (inhaled)  Substance Use Topics  . Alcohol use: Yes    Comment: occasional.  . Drug use: No   Review of Systems Constitutional: Negative for fever. Cardiovascular: Negative for chest pain. Respiratory:  Negative for shortness of breath. Gastrointestinal: Positive for flank pain Musculoskeletal: Negative for back pain. Skin: Negative for rash. Neurological: Negative for headaches, focal weakness or numbness.  All systems negative/normal/unremarkable except as stated in the HPI  ____________________________________________   PHYSICAL EXAM:  VITAL SIGNS: ED Triage Vitals  Enc Vitals Group     BP 07/15/18 0617 126/84     Pulse Rate 07/15/18 0617 66     Resp 07/15/18 0617 20     Temp 07/15/18 0623 97.6 F (36.4 C)     Temp Source 07/15/18 0617 Oral     SpO2 07/15/18 0617 99 %     Weight 07/15/18 0611 200 lb (90.7 kg)     Height 07/15/18 0611  (1.778 m)     Head Circumference --      Peak Flow --      Pain Score 07/15/18 0611 9     Pain Loc --      Pain Edu? --      Excl. in GC? --    Constitutional: Alert and oriented. Well appearing and in no distress. Eyes: Conjunctivae are normal. Normal extraocular movements. ENT      Head: Normocephalic and atraumatic.      Nose: No congestion/rhinnorhea.      Mouth/Throat: Mucous membranes are moist.      Neck: No stridor. Cardiovascular: Normal rate, regular rhythm. No murmurs, rubs, or gallops. Respiratory: Normal respiratory effort without tachypnea nor retractions. Breath sounds are clear and equal bilaterally. No wheezes/rales/rhonchi. Gastrointestinal: Right flank tenderness,  no rebound or guarding.  Normal bowel sounds. Musculoskeletal: Nontender with normal range of motion in extremities. No lower extremity tenderness nor edema. Neurologic:  Normal speech and language. No gross focal neurologic deficits are appreciated.  Skin:  Skin is warm, dry and intact. No rash noted. Psychiatric: Mood and affect are normal. Speech and behavior are normal.  ____________________________________________  ED COURSE:  As part of my medical decision making, I reviewed the following data within the electronic MEDICAL RECORD NUMBER History  obtained from family if available, nursing notes, old chart and ekg, as well as notes from prior ED visits. Patient presented for right flank pain, we will assess with labs and imaging as indicated at this time. Clinical Course as of Jul 14 1105  Fri Jul 15, 2018  0902 CT reveals small proximal right ureteral stone   [JW]    Clinical Course User Index [JW] Emily FilbertWilliams, Jonathan E, MD   Procedures  Valora PiccoloRyan J Souffrant was evaluated in Emergency Department on 07/15/2018 for the symptoms described in the history of present illness. He was evaluated in the context of the global COVID-19 pandemic, which necessitated consideration that the patient might be at risk for infection with the SARS-CoV-2 virus that causes COVID-19. Institutional protocols and algorithms that pertain to the evaluation of patients at risk for COVID-19 are in a state of rapid change based on information released by regulatory bodies including the CDC and federal and state organizations. These policies and algorithms were followed during the patient's care in the ED.  ____________________________________________   LABS (pertinent positives/negatives)  Labs Reviewed  URINALYSIS, COMPLETE (UACMP) WITH MICROSCOPIC - Abnormal; Notable for the following components:      Result Value   Color, Urine RED (*)    APPearance CLOUDY (*)    Specific Gravity, Urine 1.032 (*)    Glucose, UA   (*)    Value: TEST NOT REPORTED DUE TO COLOR INTERFERENCE OF URINE PIGMENT   Hgb urine dipstick   (*)    Value: TEST NOT REPORTED DUE TO COLOR INTERFERENCE OF URINE PIGMENT   Bilirubin Urine   (*)    Value: TEST NOT REPORTED DUE TO COLOR INTERFERENCE OF URINE PIGMENT   Ketones, ur   (*)    Value: TEST NOT REPORTED DUE TO COLOR INTERFERENCE OF URINE PIGMENT   Protein, ur   (*)    Value: TEST NOT REPORTED DUE TO COLOR INTERFERENCE OF URINE PIGMENT   Nitrite   (*)    Value: TEST NOT REPORTED DUE TO COLOR INTERFERENCE OF URINE PIGMENT   Leukocytes,Ua   (*)     Value: TEST NOT REPORTED DUE TO COLOR INTERFERENCE OF URINE PIGMENT   RBC / HPF >50 (*)    WBC, UA >50 (*)    Bacteria, UA RARE (*)    All other components within normal limits  BASIC METABOLIC PANEL - Abnormal; Notable for the following components:   Glucose, Bld 104 (*)    All other components within normal limits  CBC - Abnormal; Notable for the following components:   WBC 10.6 (*)    All other components within normal limits    RADIOLOGY Images were viewed by me  CT renal protocol IMPRESSION: Mild RIGHT hydronephrosis secondary to a 2 mm RIGHT UPJ calculus.  Additional tiny BILATERAL nonobstructing renal calculi.  Tiny LEFT inguinal and umbilical hernias containing fat. ____________________________________________   DIFFERENTIAL DIAGNOSIS   Renal colic, UTI, pyelonephritis, muscle strain  FINAL ASSESSMENT AND PLAN  Renal  colic   Plan: The patient had presented for right flank pain. Patient's labs did reveal significant blood and white blood cells in the urine. Patient's imaging did reveal a mild right hydronephrosis secondary to a 2 mm UPJ calculus.  Pain was improved here with fluids, Toradol, Zofran and morphine.  He will be discharged with pain medicine and is cleared for outpatient follow-up.  We will add antibiotics due to the degree of leukocytosis in his urine.   Ulice Dash, MD    Note: This note was generated in part or whole with voice recognition software. Voice recognition is usually quite accurate but there are transcription errors that can and very often do occur. I apologize for any typographical errors that were not detected and corrected.     Emily Filbert, MD 07/15/18 [phone]

## 2018-07-15 NOTE — ED Triage Notes (Signed)
Patient c/o right flank pain beginning immediately PTA. Patient reports hx of kidney stones.

## 2019-02-20 ENCOUNTER — Encounter: Payer: Self-pay | Admitting: Internal Medicine

## 2019-02-20 ENCOUNTER — Other Ambulatory Visit: Payer: Self-pay

## 2019-02-20 ENCOUNTER — Ambulatory Visit (INDEPENDENT_AMBULATORY_CARE_PROVIDER_SITE_OTHER): Payer: 59 | Admitting: Internal Medicine

## 2019-02-20 VITALS — BP 124/82 | HR 70 | Ht 70.0 in | Wt 210.0 lb

## 2019-02-20 DIAGNOSIS — Z0000 Encounter for general adult medical examination without abnormal findings: Secondary | ICD-10-CM

## 2019-02-20 DIAGNOSIS — F17200 Nicotine dependence, unspecified, uncomplicated: Secondary | ICD-10-CM | POA: Diagnosis not present

## 2019-02-20 DIAGNOSIS — R918 Other nonspecific abnormal finding of lung field: Secondary | ICD-10-CM | POA: Diagnosis not present

## 2019-02-20 DIAGNOSIS — E782 Mixed hyperlipidemia: Secondary | ICD-10-CM

## 2019-02-20 LAB — POCT URINALYSIS DIPSTICK
Bilirubin, UA: NEGATIVE
Blood, UA: NEGATIVE
Glucose, UA: NEGATIVE
Ketones, UA: NEGATIVE
Leukocytes, UA: NEGATIVE
Nitrite, UA: NEGATIVE
Protein, UA: NEGATIVE
Spec Grav, UA: 1.01 (ref 1.010–1.025)
Urobilinogen, UA: 0.2 E.U./dL
pH, UA: 6 (ref 5.0–8.0)

## 2019-02-20 NOTE — Progress Notes (Signed)
Date:  02/20/2019   Name:  Nathan Humphrey   DOB:  1970/03/19   MRN:  [id number]   Chief Complaint: Annual Exam Nathan Humphrey is a 49 y.o. male who presents today for his Complete Annual Exam. He feels well. He reports exercising rarely but has a physical job. He reports he is sleeping well. He had another renal stone this summer but was able to pass it. On CT in 2018 2 new pulmonary nodules were noted compared to 2015.  He never had a follow up CT to document stability.  He did smoke cigarettes 1/2 ppd x 10 years - stopped about 5 years ago.  Immunization History  Administered Date(s) Administered  . Td 05/26/2010    HPI  Lab Results  Component Value Date   CREATININE 1.20 07/15/2018   BUN 17 07/15/2018   NA 142 07/15/2018   K 4.2 07/15/2018   CL 107 07/15/2018   CO2 28 07/15/2018   Lab Results  Component Value Date   CHOL 210 (H) 07/31/2016   HDL 39 (L) 07/31/2016   LDLCALC 139 (H) 07/31/2016   TRIG 162 (H) 07/31/2016   CHOLHDL 5.4 (H) 07/31/2016   No results found for: TSH No results found for: HGBA1C   Review of Systems  Constitutional: Negative for appetite change, chills, diaphoresis, fatigue and unexpected weight change.  HENT: Negative for hearing loss, trouble swallowing and voice change.   Eyes: Negative for visual disturbance.  Respiratory: Negative for choking, chest tightness, shortness of breath and wheezing.   Cardiovascular: Negative for chest pain, palpitations and leg swelling.  Gastrointestinal: Negative for abdominal pain, blood in stool, constipation and diarrhea.  Genitourinary: Negative for difficulty urinating, dysuria, frequency, hematuria and urgency.  Musculoskeletal: Negative for arthralgias, back pain and myalgias.  Skin: Negative for color change and rash.  Neurological: Negative for dizziness, syncope and headaches.  Hematological: Negative for adenopathy.  Psychiatric/Behavioral: Negative for dysphoric mood and sleep disturbance.     Patient Active Problem List   Diagnosis Date Noted  . Hyperlipidemia, mixed 08/03/2016  . Pulmonary nodules/lesions, multiple 07/24/2016  . Bilateral inguinal hernia without obstruction or gangrene 07/24/2016  . Kidney stones 07/24/2016  . Tobacco use disorder 07/24/2016    No Known Allergies  History reviewed. No pertinent surgical history.  Social History   Tobacco Use  . Smoking status: Current Some Day Smoker    Types: Cigars  . Smokeless tobacco: Never Used  . Tobacco comment: 2-3 cigars per day only on weekends (inhaled)  Substance Use Topics  . Alcohol use: Yes    Comment: occasional.  . Drug use: No     Medication list has been reviewed and updated.  Current Meds  Medication Sig  . acetaminophen (TYLENOL) 500 MG tablet Take 500 mg by mouth every 6 (six) hours as needed.  Marland Kitchen aspirin-acetaminophen-caffeine (EXCEDRIN MIGRAINE) 250-250-65 MG tablet Take 1 tablet by mouth every 6 (six) hours as needed for headache.  . ondansetron (ZOFRAN ODT) 4 MG disintegrating tablet Take 1 tablet (4 mg total) by mouth every 8 (eight) hours as needed for nausea or vomiting.  . tamsulosin (FLOMAX) 0.4 MG CAPS capsule Take 1 capsule (0.4 mg total) by mouth daily after breakfast.    PHQ 2/9 Scores 02/20/2019 07/31/2016  PHQ - 2 Score 0 0  PHQ- 9 Score 0 -    BP Readings from Last 3 Encounters:  02/20/19 124/82  07/15/18 112/78  08/13/16 133/88    Physical Exam Vitals  and nursing note reviewed.  Constitutional:      Appearance: Normal appearance. He is well-developed.  HENT:     Head: Normocephalic.     Right Ear: Tympanic membrane, ear canal and external ear normal.     Left Ear: Tympanic membrane, ear canal and external ear normal.     Nose: Nose normal.     Mouth/Throat:     Pharynx: Uvula midline.  Eyes:     Conjunctiva/sclera: Conjunctivae normal.     Pupils: Pupils are equal, round, and reactive to light.  Neck:     Thyroid: No thyromegaly.     Vascular: No  carotid bruit.  Cardiovascular:     Rate and Rhythm: Normal rate and regular rhythm.     Heart sounds: Normal heart sounds.  Pulmonary:     Effort: Pulmonary effort is normal.     Breath sounds: Normal breath sounds. No wheezing.  Chest:     Breasts:        Right: No mass.        Left: No mass.  Abdominal:     General: Bowel sounds are normal.     Palpations: Abdomen is soft.     Tenderness: There is no abdominal tenderness.  Musculoskeletal:        General: Normal range of motion.     Cervical back: Normal range of motion and neck supple.     Right lower leg: No edema.     Left lower leg: No edema.  Lymphadenopathy:     Cervical: No cervical adenopathy.  Skin:    General: Skin is warm and dry.     Capillary Refill: Capillary refill takes less than 2 seconds.     Comments: 3 mm soft flesh colored tag vs nevus behind right ear.  Neurological:     General: No focal deficit present.     Mental Status: He is alert and oriented to person, place, and time.     Deep Tendon Reflexes: Reflexes are normal and symmetric.  Psychiatric:        Speech: Speech normal.        Behavior: Behavior normal.        Thought Content: Thought content normal.        Judgment: Judgment normal.     Wt Readings from Last 3 Encounters:  02/20/19 210 lb (95.3 kg)  07/15/18 200 lb (90.7 kg)  08/13/16 203 lb (92.1 kg)    BP 124/82   Pulse 70   Ht 5\' 10"  (1.778 m)   Wt 210 lb (95.3 kg)   SpO2 99%   BMI 30.13 kg/m   Assessment and Plan: 1. Annual physical exam Normal exam except for weight is borderline Recommend diet changes for weight management Skin lesion behind ear appears benign - follow up with Dermatology if it is changing - CBC with Differential/Platelet - Comprehensive metabolic panel - POCT urinalysis dipstick  2. Hyperlipidemia, mixed Cholesterol has been borderline elevated in the past but no medications were recommended If much higher, will discuss options.  Work on low fat  diet - Lipid panel  3. Tobacco use disorder Pt is smoking cigars only several times per week Hx of cigarettes 5 pk/yr - quit 5 years ago  4. Pulmonary nodules/lesions, multiple Seen on CT 2018 2 nodules in the lingula Recommend follow up CT to document stability - CT CHEST NODULE FOLLOW UP LOW DOSE W/O; Future   Partially dictated using 2019. Any errors are unintentional.  Halina Maidens, MD [facility]  02/20/2019

## 2019-02-21 LAB — COMPREHENSIVE METABOLIC PANEL
ALT: 30 IU/L (ref 0–44)
AST: 25 IU/L (ref 0–40)
Albumin/Globulin Ratio: 2.3 — ABNORMAL HIGH (ref 1.2–2.2)
Albumin: 4.5 g/dL (ref 4.0–5.0)
Alkaline Phosphatase: 125 IU/L — ABNORMAL HIGH (ref 39–117)
BUN/Creatinine Ratio: 9 (ref 9–20)
BUN: 10 mg/dL (ref 6–24)
Bilirubin Total: 0.3 mg/dL (ref 0.0–1.2)
CO2: 22 mmol/L (ref 20–29)
Calcium: 9.6 mg/dL (ref 8.7–10.2)
Chloride: 103 mmol/L (ref 96–106)
Creatinine, Ser: 1.09 mg/dL (ref 0.76–1.27)
GFR calc Af Amer: 94 mL/min/{1.73_m2} (ref >59)
GFR calc non Af Amer: 81 mL/min/{1.73_m2} (ref >59)
Globulin, Total: 2 g/dL (ref 1.5–4.5)
Glucose: 93 mg/dL (ref 65–99)
Potassium: 4.4 mmol/L (ref 3.5–5.2)
Sodium: 139 mmol/L (ref 134–144)
Total Protein: 6.5 g/dL (ref 6.0–8.5)

## 2019-02-21 LAB — CBC WITH DIFFERENTIAL/PLATELET
Basophils Absolute: 0.1 10*3/uL (ref 0.0–0.2)
Basos: 1 %
EOS (ABSOLUTE): 0.3 10*3/uL (ref 0.0–0.4)
Eos: 4 %
Hematocrit: 46.1 % (ref 37.5–51.0)
Hemoglobin: 15.9 g/dL (ref 13.0–17.7)
Immature Grans (Abs): 0.1 10*3/uL (ref 0.0–0.1)
Immature Granulocytes: 1 %
Lymphocytes Absolute: 2.2 10*3/uL (ref 0.7–3.1)
Lymphs: 30 %
MCH: 31.3 pg (ref 26.6–33.0)
MCHC: 34.5 g/dL (ref 31.5–35.7)
MCV: 91 fL (ref 79–97)
Monocytes Absolute: 0.6 10*3/uL (ref 0.1–0.9)
Monocytes: 9 %
Neutrophils Absolute: 4 10*3/uL (ref 1.4–7.0)
Neutrophils: 55 %
Platelets: 167 10*3/uL (ref 150–450)
RBC: 5.08 x10E6/uL (ref 4.14–5.80)
RDW: 12.4 % (ref 11.6–15.4)
WBC: 7.2 10*3/uL (ref 3.4–10.8)

## 2019-02-21 LAB — LIPID PANEL
Chol/HDL Ratio: 6.5 ratio — ABNORMAL HIGH (ref 0.0–5.0)
Cholesterol, Total: 215 mg/dL — ABNORMAL HIGH (ref 100–199)
HDL: 33 mg/dL — ABNORMAL LOW (ref >39)
LDL Chol Calc (NIH): 134 mg/dL — ABNORMAL HIGH (ref 0–99)
Triglycerides: 267 mg/dL — ABNORMAL HIGH (ref 0–149)
VLDL Cholesterol Cal: 48 mg/dL — ABNORMAL HIGH (ref 5–40)

## 2019-02-24 ENCOUNTER — Other Ambulatory Visit: Payer: Self-pay | Admitting: Internal Medicine

## 2019-02-24 DIAGNOSIS — R918 Other nonspecific abnormal finding of lung field: Secondary | ICD-10-CM

## 2019-03-01 ENCOUNTER — Ambulatory Visit: Payer: 59

## 2019-03-08 ENCOUNTER — Ambulatory Visit (INDEPENDENT_AMBULATORY_CARE_PROVIDER_SITE_OTHER): Payer: 59 | Admitting: Internal Medicine

## 2019-03-08 ENCOUNTER — Other Ambulatory Visit: Payer: Self-pay

## 2019-03-08 ENCOUNTER — Encounter: Payer: Self-pay | Admitting: Internal Medicine

## 2019-03-08 VITALS — Temp 98.0°F | Ht 70.0 in | Wt 210.0 lb

## 2019-03-08 DIAGNOSIS — U071 COVID-19: Secondary | ICD-10-CM

## 2019-03-08 DIAGNOSIS — R05 Cough: Secondary | ICD-10-CM

## 2019-03-08 DIAGNOSIS — R059 Cough, unspecified: Secondary | ICD-10-CM

## 2019-03-08 MED ORDER — HYDROCODONE-HOMATROPINE 5-1.5 MG/5ML PO SYRP
5.0000 mL | ORAL_SOLUTION | Freq: Four times a day (QID) | ORAL | 0 refills | Status: AC | PRN
Start: 2019-03-08 — End: 2019-03-18

## 2019-03-08 NOTE — Progress Notes (Signed)
Date:  03/08/2019   Name:  Nathan Humphrey   DOB:  1970-07-05   MRN:  [id number]  I connected with this patient, Nathan Humphrey, by telephone at the patient's home.  I verified that I am speaking with the correct person using two identifiers. This visit was conducted via telephone due to the Covid-19 outbreak from my office at [facility] in [place], [place]. I discussed the limitations, risks, security and privacy concerns of performing an evaluation and management service by telephone. I also discussed with the patient that there may be a patient responsible charge related to this service. The patient expressed understanding and agreed to proceed.  Chief Complaint: Cough (Pt test POS for Covid and his cough is getting worse. Would like meds to help. Was tested at  [facility] in [place] on 01/25. Coughing up dark mucous. Not much SOB. Lungs feel like they are on fire. )  Cough This is a new problem. The current episode started in the past 7 days. The problem has been gradually worsening. The problem occurs every few minutes. The cough is non-productive. Associated symptoms include chills, a fever, myalgias and wheezing. Pertinent negatives include no chest pain, headaches or shortness of breath. Associated symptoms comments: Temp 98 initially. He has tried OTC cough suppressant (nasal spray, robitussin, and no tylenol) for the symptoms.  He developed cough and body aches with loss of taste on Friday, 5 days ago.  He went 2 days ago and had rapid testing in [place].  He has been home since, trying to rest but digging holes in his yard today.  He describes the cough as coming in episodes that are so hard that he feels like he will pass out.  Maybe he has had some mild wheezing but he does not feel SOB with minimal activity.  He is concerned that he had a bout of pneumonia years ago that went unrecognized for 6 months.  Lab Results  Component Value Date   CREATININE 1.09 02/20/2019   BUN 10  02/20/2019   NA 139 02/20/2019   K 4.4 02/20/2019   CL 103 02/20/2019   CO2 22 02/20/2019   Lab Results  Component Value Date   CHOL 215 (H) 02/20/2019   HDL 33 (L) 02/20/2019   LDLCALC 134 (H) 02/20/2019   TRIG 267 (H) 02/20/2019   CHOLHDL 6.5 (H) 02/20/2019   No results found for: TSH No results found for: HGBA1C   Review of Systems  Constitutional: Positive for chills and fever.  HENT: Negative for trouble swallowing.   Eyes: Negative for visual disturbance.  Respiratory: Positive for cough and wheezing. Negative for shortness of breath.   Cardiovascular: Negative for chest pain and palpitations.  Musculoskeletal: Positive for myalgias.  Neurological: Negative for dizziness, light-headedness and headaches.    Patient Active Problem List   Diagnosis Date Noted  . Hyperlipidemia, mixed 08/03/2016  . Pulmonary nodules/lesions, multiple 07/24/2016  . Bilateral inguinal hernia without obstruction or gangrene 07/24/2016  . Kidney stones 07/24/2016  . Tobacco use disorder 07/24/2016    No Known Allergies  History reviewed. No pertinent surgical history.  Social History   Tobacco Use  . Smoking status: Current Some Day Smoker    Types: Cigars  . Smokeless tobacco: Never Used  . Tobacco comment: 2-3 cigars per day only on weekends (inhaled)  Substance Use Topics  . Alcohol use: Yes    Comment: occasional.  . Drug use: No     Medication  list has been reviewed and updated.  No outpatient medications have been marked as taking for the 03/08/19 encounter (Office Visit) with Reubin Milan, MD.    [facility] 2/9 Scores 03/08/2019 02/20/2019 07/31/2016  PHQ - 2 Score 0 0 0  PHQ- 9 Score 0 0 -    BP Readings from Last 3 Encounters:  02/20/19 124/82  07/15/18 112/78  08/13/16 133/88    Physical Exam Pulmonary:     Comments: Moderate dry sounding cough once during the call.  No dyspnea is appreciated Neurological:     Mental Status: He is alert.     Wt Readings  from Last 3 Encounters:  03/08/19 210 lb (95.3 kg)  02/20/19 210 lb (95.3 kg)  07/15/18 200 lb (90.7 kg)    Temp 98 F (36.7 C) (Oral)   Ht 5\' 10"  (1.778 m)   Wt 210 lb (95.3 kg)   BMI 30.13 kg/m   Assessment and Plan: 1. COVID-19 virus detected Tested positive on 2 days ago, 3 days after onset of symptoms I re-enforced rest and fluids and quarantine x 14 days if symptoms resolve. His risk of complications is low so will attempt home management No indication for antibiotics or steroids at this time  2. Cough He needs cough suppressant so he can rest. He may be a candidate for the respiratory clinic tomorrow evening - I have asked him to call in the morning if he does not get some rest and relief with the Hycodan. - HYDROcodone-homatropine (HYCODAN) 5-1.5 MG/5ML syrup; Take 5 mLs by mouth every 6 (six) hours as needed for up to 10 days for cough.  Dispense: 473 mL; Refill: 0   Partially dictated using . Any errors are unintentional.  Animal nutritionist, MD [facility]  03/08/2019

## 2019-03-12 ENCOUNTER — Encounter: Payer: Self-pay | Admitting: Internal Medicine

## 2020-02-21 ENCOUNTER — Other Ambulatory Visit: Payer: Self-pay

## 2020-02-21 ENCOUNTER — Encounter: Payer: Self-pay | Admitting: Internal Medicine

## 2020-02-21 ENCOUNTER — Ambulatory Visit (INDEPENDENT_AMBULATORY_CARE_PROVIDER_SITE_OTHER): Payer: 59 | Admitting: Internal Medicine

## 2020-02-21 VITALS — BP 104/78 | HR 69 | Temp 97.7°F | Ht 70.0 in | Wt 209.0 lb

## 2020-02-21 DIAGNOSIS — E782 Mixed hyperlipidemia: Secondary | ICD-10-CM | POA: Diagnosis not present

## 2020-02-21 DIAGNOSIS — R059 Cough, unspecified: Secondary | ICD-10-CM | POA: Diagnosis not present

## 2020-02-21 DIAGNOSIS — Z0000 Encounter for general adult medical examination without abnormal findings: Secondary | ICD-10-CM

## 2020-02-21 DIAGNOSIS — R918 Other nonspecific abnormal finding of lung field: Secondary | ICD-10-CM

## 2020-02-21 DIAGNOSIS — Z1159 Encounter for screening for other viral diseases: Secondary | ICD-10-CM

## 2020-02-21 MED ORDER — FLOVENT HFA 44 MCG/ACT IN AERO: 1.0000 | INHALATION_SPRAY | Freq: Two times a day (BID) | RESPIRATORY_TRACT | 12 refills | Status: DC

## 2020-02-21 NOTE — Progress Notes (Signed)
Date:  02/21/2020   Name:  Nathan Humphrey   DOB:  03-13-1970   MRN:  [id number]   Chief Complaint: Annual Exam  Nathan Humphrey is a 50 y.o. male who presents today for his Complete Annual Exam. He feels well. He reports exercising none. He reports he is sleeping well.   Colonoscopy: not due  Immunization History  Administered Date(s) Administered  . Td 05/26/2010    Cough This is a recurrent problem. The current episode started more than 1 year ago. The problem has been unchanged. The problem occurs every few hours. The cough is non-productive. Pertinent negatives include no chest pain, chills, headaches, myalgias, postnasal drip, rash, shortness of breath or wheezing. Nothing (maybe worse when lying down) aggravates the symptoms. He has tried nothing for the symptoms. There is no history of environmental allergies. Covid in 02/2019    Pulmonary nodules - seen on CT stone study in 2018 - chest CT ordered but pt has not follow through.  He continues to smoke cigars.  Lab Results  Component Value Date   CREATININE 1.09 02/20/2019   BUN 10 02/20/2019   NA 139 02/20/2019   K 4.4 02/20/2019   CL 103 02/20/2019   CO2 22 02/20/2019   Lab Results  Component Value Date   CHOL 215 (H) 02/20/2019   HDL 33 (L) 02/20/2019   LDLCALC 134 (H) 02/20/2019   TRIG 267 (H) 02/20/2019   CHOLHDL 6.5 (H) 02/20/2019   No results found for: TSH No results found for: HGBA1C Lab Results  Component Value Date   WBC 7.2 02/20/2019   HGB 15.9 02/20/2019   HCT 46.1 02/20/2019   MCV 91 02/20/2019   PLT 167 02/20/2019   Lab Results  Component Value Date   ALT 30 02/20/2019   AST 25 02/20/2019   ALKPHOS 125 (H) 02/20/2019   BILITOT 0.3 02/20/2019     Review of Systems  Constitutional: Negative for appetite change, chills, diaphoresis, fatigue and unexpected weight change.  HENT: Negative for hearing loss, postnasal drip, sinus pressure, tinnitus and trouble swallowing.   Eyes: Negative for  visual disturbance.  Respiratory: Positive for cough. Negative for choking, shortness of breath and wheezing.   Cardiovascular: Negative for chest pain, palpitations and leg swelling.  Gastrointestinal: Negative for abdominal pain, blood in stool, constipation and diarrhea.       No GERD  Genitourinary: Negative for difficulty urinating, dysuria and frequency.  Musculoskeletal: Negative for arthralgias, back pain and myalgias.  Skin: Negative for color change and rash.  Allergic/Immunologic: Negative for environmental allergies and food allergies.  Neurological: Negative for dizziness, focal weakness, syncope and headaches.  Hematological: Negative for adenopathy.  Psychiatric/Behavioral: Negative for dysphoric mood and sleep disturbance.    Patient Active Problem List   Diagnosis Date Noted  . Hyperlipidemia, mixed 08/03/2016  . Pulmonary nodules/lesions, multiple 07/24/2016  . Bilateral inguinal hernia without obstruction or gangrene 07/24/2016  . Kidney stones 07/24/2016  . Tobacco use disorder 07/24/2016    No Known Allergies  History reviewed. No pertinent surgical history.  Social History   Tobacco Use  . Smoking status: Current Some Day Smoker    Types: Cigars  . Smokeless tobacco: Never Used  . Tobacco comment: 2-3 cigars per day only on weekends (inhaled)  Vaping Use  . Vaping Use: Former  Substance Use Topics  . Alcohol use: Yes    Comment: occasional.  . Drug use: No     Medication list has  been reviewed and updated.  No outpatient medications have been marked as taking for the 02/21/20 encounter (Office Visit) with Reubin Milan, MD.    [facility] 2/9 Scores 02/21/2020 03/08/2019 02/20/2019 07/31/2016  PHQ - 2 Score 0 0 0 0  PHQ- 9 Score 0 0 0 -    GAD 7 : Generalized Anxiety Score 02/21/2020  Nervous, Anxious, on Edge 0  Control/stop worrying 0  Worry too much - different things 0  Trouble relaxing 0  Restless 0  Easily annoyed or irritable 0  Afraid -  awful might happen 0  Total GAD 7 Score 0    BP Readings from Last 3 Encounters:  02/21/20 104/78  02/20/19 124/82  07/15/18 112/78    Physical Exam Vitals and nursing note reviewed.  Constitutional:      Appearance: Normal appearance. He is well-developed.  HENT:     Head: Normocephalic.     Right Ear: Tympanic membrane, ear canal and external ear normal.     Left Ear: Tympanic membrane, ear canal and external ear normal.     Nose: Nose normal.  Eyes:     Pupils: Pupils are equal, round, and reactive to light.  Neck:     Thyroid: No thyromegaly.     Vascular: No carotid bruit.  Cardiovascular:     Rate and Rhythm: Normal rate and regular rhythm.     Pulses: Normal pulses.     Heart sounds: Normal heart sounds.  Pulmonary:     Effort: Pulmonary effort is normal.     Breath sounds: Normal breath sounds. No wheezing or rhonchi.  Chest:  Breasts:     Right: No mass.     Left: No mass.    Abdominal:     General: Bowel sounds are normal.     Palpations: Abdomen is soft. There is no mass.     Tenderness: There is no abdominal tenderness.  Musculoskeletal:        General: Normal range of motion.     Cervical back: Normal range of motion and neck supple.     Right lower leg: No edema.     Left lower leg: No edema.  Lymphadenopathy:     Cervical: No cervical adenopathy.  Skin:    General: Skin is warm and dry.     Capillary Refill: Capillary refill takes less than 2 seconds.  Neurological:     General: No focal deficit present.     Mental Status: He is alert and oriented to person, place, and time.     Deep Tendon Reflexes: Reflexes are normal and symmetric.  Psychiatric:        Attention and Perception: Attention normal.        Mood and Affect: Mood normal.        Thought Content: Thought content normal.     Wt Readings from Last 3 Encounters:  02/21/20 209 lb (94.8 kg)  03/08/19 210 lb (95.3 kg)  02/20/19 210 lb (95.3 kg)    BP 104/78   Pulse 69   Temp  97.7 F (36.5 C) (Oral)   Ht 5\' 10"  (1.778 m)   Wt 209 lb (94.8 kg)   SpO2 98%   BMI 29.99 kg/m   Assessment and Plan: 1. Annual physical exam Normal exam; continue physical activity; regular exercise recommended - CBC with Differential/Platelet - Comprehensive metabolic panel  2. Need for hepatitis C screening test - Hepatitis C antibody  3. Pulmonary nodules/lesions, multiple Seen on CT stone  study 2018  He smokes cigars and has a chronic cough since last year - CT Chest Wo Contrast; Future  4. Hyperlipidemia, mixed - Lipid panel  5. Cough Since Covid infection last year Will try Flovent bid - if no improvement after one month, discontinue - fluticasone (FLOVENT HFA) 44 MCG/ACT inhaler; Inhale 1 puff into the lungs in the morning and at bedtime.  Dispense: 1 each; Refill: 12   Partially dictated using Animal nutritionist. Any errors are unintentional.  Bari Edward, MD [facility]  02/21/2020

## 2020-02-22 LAB — COMPREHENSIVE METABOLIC PANEL
ALT: 28 IU/L (ref 0–44)
AST: 22 IU/L (ref 0–40)
Albumin/Globulin Ratio: 1.8 (ref 1.2–2.2)
Albumin: 4.5 g/dL (ref 4.0–5.0)
Alkaline Phosphatase: 130 IU/L — ABNORMAL HIGH (ref 44–121)
BUN/Creatinine Ratio: 13 (ref 9–20)
BUN: 17 mg/dL (ref 6–24)
Bilirubin Total: 0.5 mg/dL (ref 0.0–1.2)
CO2: 25 mmol/L (ref 20–29)
Calcium: 9.9 mg/dL (ref 8.7–10.2)
Chloride: 103 mmol/L (ref 96–106)
Creatinine, Ser: 1.3 mg/dL — ABNORMAL HIGH (ref 0.76–1.27)
GFR calc Af Amer: 75 mL/min/{1.73_m2} (ref >59)
GFR calc non Af Amer: 65 mL/min/{1.73_m2} (ref >59)
Globulin, Total: 2.5 g/dL (ref 1.5–4.5)
Glucose: 88 mg/dL (ref 65–99)
Potassium: 4.6 mmol/L (ref 3.5–5.2)
Sodium: 142 mmol/L (ref 134–144)
Total Protein: 7 g/dL (ref 6.0–8.5)

## 2020-02-22 LAB — HEPATITIS C ANTIBODY: Hep C Virus Ab: <0.1 s/co ratio (ref 0.0–0.9)

## 2020-02-22 LAB — CBC WITH DIFFERENTIAL/PLATELET
Basophils Absolute: 0.1 10*3/uL (ref 0.0–0.2)
Basos: 1 %
EOS (ABSOLUTE): 0.4 10*3/uL (ref 0.0–0.4)
Eos: 5 %
Hematocrit: 50.6 % (ref 37.5–51.0)
Hemoglobin: 16.8 g/dL (ref 13.0–17.7)
Immature Grans (Abs): 0.1 10*3/uL (ref 0.0–0.1)
Immature Granulocytes: 1 %
Lymphocytes Absolute: 2.3 10*3/uL (ref 0.7–3.1)
Lymphs: 31 %
MCH: 31 pg (ref 26.6–33.0)
MCHC: 33.2 g/dL (ref 31.5–35.7)
MCV: 93 fL (ref 79–97)
Monocytes Absolute: 0.9 10*3/uL (ref 0.1–0.9)
Monocytes: 11 %
Neutrophils Absolute: 3.9 10*3/uL (ref 1.4–7.0)
Neutrophils: 51 %
Platelets: 179 10*3/uL (ref 150–450)
RBC: 5.42 x10E6/uL (ref 4.14–5.80)
RDW: 12.7 % (ref 11.6–15.4)
WBC: 7.6 10*3/uL (ref 3.4–10.8)

## 2020-02-22 LAB — LIPID PANEL
Chol/HDL Ratio: 6.8 ratio — ABNORMAL HIGH (ref 0.0–5.0)
Cholesterol, Total: 225 mg/dL — ABNORMAL HIGH (ref 100–199)
HDL: 33 mg/dL — ABNORMAL LOW (ref >39)
LDL Chol Calc (NIH): 155 mg/dL — ABNORMAL HIGH (ref 0–99)
Triglycerides: 202 mg/dL — ABNORMAL HIGH (ref 0–149)
VLDL Cholesterol Cal: 37 mg/dL (ref 5–40)

## 2020-03-06 ENCOUNTER — Ambulatory Visit
Admission: RE | Admit: 2020-03-06 | Discharge: 2020-03-06 | Disposition: A | Discharge status: Home / Self Care | Payer: 59 | Source: Ambulatory Visit | Attending: Internal Medicine | Admitting: Internal Medicine

## 2020-03-06 ENCOUNTER — Other Ambulatory Visit: Payer: Self-pay

## 2020-03-06 DIAGNOSIS — R918 Other nonspecific abnormal finding of lung field: Secondary | ICD-10-CM

## 2020-03-08 ENCOUNTER — Ambulatory Visit: Payer: 59

## 2020-03-16 ENCOUNTER — Emergency Department: Payer: 59

## 2020-03-16 ENCOUNTER — Encounter: Payer: Self-pay | Admitting: Emergency Medicine

## 2020-03-16 ENCOUNTER — Emergency Department
Admission: EM | Admit: 2020-03-16 | Discharge: 2020-03-16 | Disposition: A | Discharge status: Home / Self Care | Payer: 59 | Attending: Emergency Medicine | Admitting: Emergency Medicine

## 2020-03-16 ENCOUNTER — Other Ambulatory Visit: Payer: Self-pay

## 2020-03-16 DIAGNOSIS — L03116 Cellulitis of left lower limb: Secondary | ICD-10-CM | POA: Insufficient documentation

## 2020-03-16 DIAGNOSIS — R600 Localized edema: Secondary | ICD-10-CM | POA: Diagnosis not present

## 2020-03-16 DIAGNOSIS — N189 Chronic kidney disease, unspecified: Secondary | ICD-10-CM | POA: Diagnosis not present

## 2020-03-16 DIAGNOSIS — F17290 Nicotine dependence, other tobacco product, uncomplicated: Secondary | ICD-10-CM | POA: Diagnosis not present

## 2020-03-16 DIAGNOSIS — Z7982 Long term (current) use of aspirin: Secondary | ICD-10-CM | POA: Diagnosis not present

## 2020-03-16 DIAGNOSIS — M25562 Pain in left knee: Secondary | ICD-10-CM | POA: Diagnosis present

## 2020-03-16 MED ORDER — CEPHALEXIN 500 MG PO CAPS: 500.0000 mg | ORAL_CAPSULE | Freq: Four times a day (QID) | ORAL | 0 refills | Status: DC

## 2020-03-16 MED ORDER — SULFAMETHOXAZOLE-TRIMETHOPRIM 800-160 MG PO TABS
1.0000 | ORAL_TABLET | Freq: Once | ORAL | Status: AC
  Administered 2020-03-16: 1 via ORAL
  Filled 2020-03-16: qty 1

## 2020-03-16 MED ORDER — CEPHALEXIN 500 MG PO CAPS
500.0000 mg | ORAL_CAPSULE | Freq: Once | ORAL | Status: AC
  Administered 2020-03-16: 500 mg via ORAL
  Filled 2020-03-16: qty 1

## 2020-03-16 MED ORDER — SULFAMETHOXAZOLE-TRIMETHOPRIM 800-160 MG PO TABS: 1.0000 | ORAL_TABLET | Freq: Two times a day (BID) | ORAL | 0 refills | Status: AC

## 2020-03-16 MED ORDER — DEXAMETHASONE 10 MG/ML FOR PEDIATRIC ORAL USE
10.0000 mg | Freq: Once | INTRAMUSCULAR | Status: AC
  Administered 2020-03-16: 10 mg via ORAL
  Filled 2020-03-16: qty 1

## 2020-03-16 NOTE — Discharge Instructions (Signed)
You have been seen today in the Emergency Department (ED) for cellulitis, a superficial skin infection. Please take your antibiotics as prescribed for their ENTIRE prescribed duration.  Take Tylenol or Motrin as needed for pain, but only as written on the box.   Please follow up with your doctor in about a week.  Call your doctor sooner or return to the ED if you develop worsening signs of infection such as: increased redness, increased pain, pus, fever, or other symptoms that concern you.

## 2020-03-16 NOTE — ED Notes (Signed)
Pt arrives from waiting room via w/c -- awake and alert - no acute distress noted.

## 2020-03-16 NOTE — ED Provider Notes (Signed)
[facility] Emergency Department Provider Note  ____________________________________________   Event Date/Time   First MD Initiated Contact with Patient 03/16/20 [phone]     (approximate)  I have reviewed the triage vital signs and the nursing notes.   HISTORY  Chief Complaint Leg Pain    HPI Nathan Humphrey is a 50 y.o. male with no contributory past medical history who presents for evaluation of  several days of pain in the back of his left knee.  There is some redness and thickening of the skin and several people he knows told him that they were worried he might have a blood clot.  Area is red and warm and painful.  There is no swelling of the leg itself, just of the reddened area.  He has no history of blood clots in the legs or lungs.  He has not had any recent surgeries or long trips or other immobilizations.  He has not had any recent fever, sore throat, chest pain, shortness of breath, nausea, vomiting, nor abdominal pain.  No recent traumas.  Nothing in particular makes the symptoms better or worse.  He notes that a couple weeks ago he went to his primary care doctor and they identified a patch of eczema lower down on his left calf but that seems to be getting better.  The redness and pain behind the left knee is acute in onset and at least moderate in severity.        Past Medical History:  Diagnosis Date  . Bilateral inguinal hernia   . Bilateral inguinal hernia without obstruction or gangrene 07/24/2016  . Bright red rectal bleeding 07/24/2016  . Chronic kidney disease    kidney stones  . Hyperlipidemia, mixed 08/03/2016   10 yr risk 7.5 %  . Kidney stones 07/24/2016   Has had several episodes of stones  . Migraines   . Pulmonary nodules/lesions, multiple 07/24/2016   CT scan in one year to follow up 2 new nodules in Lingula (5 mm in size)  . Tobacco use disorder 07/24/2016    Patient Active Problem List   Diagnosis Date Noted  . Hyperlipidemia,  mixed 08/03/2016  . Pulmonary nodules/lesions, multiple 07/24/2016  . Bilateral inguinal hernia without obstruction or gangrene 07/24/2016  . Kidney stones 07/24/2016  . Tobacco use disorder 07/24/2016    History reviewed. No pertinent surgical history.  Prior to Admission medications   Medication Sig Start Date End Date Taking? Authorizing Provider  cephALEXin (KEFLEX) 500 MG capsule Take 1 capsule (500 mg total) by mouth 4 (four) times daily. 03/16/20  Yes Loleta Rose, MD  sulfamethoxazole-trimethoprim (BACTRIM DS) 800-160 MG tablet Take 1 tablet by mouth 2 (two) times daily for 10 days. 03/16/20 03/26/20 Yes Loleta Rose, MD  Aspirin-Acetaminophen-Caffeine (EXCEDRIN PO) Take by mouth as needed.    [provider]  fluticasone (FLOVENT HFA) 44 MCG/ACT inhaler Inhale 1 puff into the lungs in the morning and at bedtime. 02/21/20   Reubin Milan, MD    Allergies Patient has no known allergies.  Family History  Problem Relation Age of Onset  . CAD Mother 28    Social History Social History   Tobacco Use  . Smoking status: Current Some Day Smoker    Types: Cigars  . Smokeless tobacco: Never Used  . Tobacco comment: 2-3 cigars per day only on weekends (inhaled)  Vaping Use  . Vaping Use: Former  Substance Use Topics  . Alcohol use: Yes  Comment: occasional.  . Drug use: No    Review of Systems Constitutional: No fever/chills Eyes: No visual changes. ENT: No sore throat. Cardiovascular: Denies chest pain. Respiratory: Denies shortness of breath. Gastrointestinal: No abdominal pain.  No nausea, no vomiting.  No diarrhea.  No constipation. Genitourinary: Negative for dysuria. Musculoskeletal: Left leg pain behind the left knee.  Negative for neck pain.  Negative for back pain. Integumentary: Painful red area behind his left knee. Neurological: Negative for headaches, focal weakness or numbness.   ____________________________________________   PHYSICAL  EXAM:  VITAL SIGNS: ED Triage Vitals  Enc Vitals Group     BP 03/16/20 0054 122/88     Pulse Rate 03/16/20 0054 98     Resp 03/16/20 0054 19     Temp 03/16/20 0054 97.9 F (36.6 C)     Temp Source 03/16/20 0054 Oral     SpO2 03/16/20 0054 99 %     Weight 03/16/20 0054 93 kg (205 lb)     Height 03/16/20 0054 1.778 m (5\' 10" )     Head Circumference --      Peak Flow --      Pain Score 03/16/20 0110 5     Pain Loc --      Pain Edu? --      Excl. in GC? --     Constitutional: Alert and oriented.  Eyes: Conjunctivae are normal.  Head: Atraumatic. Nose: No congestion/rhinnorhea. Mouth/Throat: Patient is wearing a mask. Neck: No stridor.  No meningeal signs.   Cardiovascular: Normal rate, regular rhythm. Good peripheral circulation. Respiratory: Normal respiratory effort.  No retractions. Gastrointestinal: Soft and nontender. No distention.  Musculoskeletal: Normal-appearing left lower extremity except for the skin exam as listed below.  No knee effusion. Neurologic:  Normal speech and language. No gross focal neurologic deficits are appreciated.  Skin:  Skin is warm, dry and intact.  The patient has an area of warm erythema and skin thickening/induration about palm-width in diameter that is located in the medial aspect of the popliteal fossa.  There is no fluctuance or specific area that would suggest an abscess and it appears consistent with an area of cellulitis without an obvious origin source.  More distal but unconnected to the area of cellulitis is an area that appears consistent with superficial and healing eczema. Psychiatric: Mood and affect are normal. Speech and behavior are normal.  ____________________________________________   LABS (all labs ordered are listed, but only abnormal results are displayed)  Labs Reviewed - No data to display ____________________________________________  EKG  No indication for emergent  EKG ____________________________________________  RADIOLOGY I, 05/14/20, personally viewed and evaluated these images (plain radiographs) as part of my medical decision making, as well as reviewing the written report by the radiologist.  ED MD interpretation: No indication of DVT.  There is some subcutaneous edema at the medial left knee consistent with the physical exam findings listed above.  Official radiology report(s): Loleta Rose Venous Img Lower Unilateral Left  Result Date: 03/16/2020 CLINICAL DATA:  Left leg pain and tenderness to touch. EXAM: LEFT LOWER EXTREMITY VENOUS DOPPLER ULTRASOUND TECHNIQUE: Gray-scale sonography with compression, as well as color and duplex ultrasound, were performed to evaluate the deep venous system(s) from the level of the common femoral vein through the popliteal and proximal calf veins. COMPARISON:  None. FINDINGS: VENOUS Normal compressibility of the common femoral, superficial femoral, and popliteal veins, as well as the visualized calf veins. Visualized portions of profunda femoral vein and great saphenous  vein unremarkable. No filling defects to suggest DVT on grayscale or color Doppler imaging. Doppler waveforms show normal direction of venous flow, normal respiratory plasticity and response to augmentation. Limited views of the contralateral common femoral vein are unremarkable. OTHER Mild and nonspecific subcutaneous edema at the area of medial left knee swelling. IMPRESSION: 1. Negative for left lower extremity DVT. 2. Nonspecific subcutaneous edema at the level of the medial left knee. Electronically Signed   By: Marnee Spring M.D.   On: 03/16/2020 04:41    ____________________________________________   PROCEDURES   Procedure(s) performed (including Critical Care):  Procedures   ____________________________________________   INITIAL IMPRESSION / MDM / ASSESSMENT AND PLAN / ED COURSE  As part of my medical decision making, I reviewed the  following data within the electronic MEDICAL RECORD NUMBER Nursing notes reviewed and incorporated, Old chart reviewed and Notes from prior ED visits   Differential diagnosis includes, but is not limited to, DVT, Baker's cyst, cellulitis, abscess.  Patient is well-appearing in no distress with normal vital signs, afebrile, no tachycardia.  No musculoskeletal issues on physical exam.  Ultrasound is negative for DVT but notable for some subcutaneous edema which corresponds to an area of cellulitis in the popliteal fossa, specifically the medial aspect of the left knee.  No reason to suspect joint infection and the patient's pain/tenderness is minimal.  Given no systemic signs or symptoms and relatively isolated area with no evidence of abscess on ultrasound nor physical exam, I believe the patient will tolerate well a course of outpatient antibiotics.  I prescribed both Bactrim DS and Keflex for 10 days and encourage close follow-up with his primary care provider in this coming week.  I also gave him a dose of Decadron 10 mg by mouth as this has been shown to reduce inflammation and reduced ED bounce backs for successful course of outpatient treatment.  He understands and agrees with the plan and I gave my usual and customary return precautions.           ____________________________________________  FINAL CLINICAL IMPRESSION(S) / ED DIAGNOSES  Final diagnoses:  Left leg cellulitis     MEDICATIONS GIVEN DURING THIS VISIT:  Medications  cephALEXin (KEFLEX) capsule 500 mg (500 mg Oral Given 03/16/20 0718)  sulfamethoxazole-trimethoprim (BACTRIM DS) 800-160 MG per tablet 1 tablet (1 tablet Oral Given 03/16/20 0717)  dexamethasone (DECADRON) 10 MG/ML injection for Pediatric ORAL use 10 mg (10 mg Oral Given 03/16/20 0717)     ED Discharge Orders         Ordered    sulfamethoxazole-trimethoprim (BACTRIM DS) 800-160 MG tablet  2 times daily        03/16/20 0651    cephALEXin (KEFLEX) 500 MG capsule  4  times daily        03/16/20 6203          *Please note:  TAU RAMAN was evaluated in Emergency Department on 03/16/2020 for the symptoms described in the history of present illness. He was evaluated in the context of the global COVID-19 pandemic, which necessitated consideration that the patient might be at risk for infection with the SARS-CoV-2 virus that causes COVID-19. Institutional protocols and algorithms that pertain to the evaluation of patients at risk for COVID-19 are in a state of rapid change based on information released by regulatory bodies including the CDC and federal and state organizations. These policies and algorithms were followed during the patient's care in the ED.  Some ED evaluations and interventions may  be delayed as a result of limited staffing during and after the pandemic.*  Note:  This document was prepared using Dragon voice recognition software and may include unintentional dictation errors.   Loleta Rose, MD 03/16/20 0800

## 2020-03-16 NOTE — ED Triage Notes (Signed)
Pt reports he has been dealing with left leg pain since Thursday, pt reports pain located behind the knee, pt reports pain increases when he walks reports area is red and tender to touch. Pt denies any injuries to area. Pt talks in complete sentences no distress noted

## 2020-03-18 ENCOUNTER — Ambulatory Visit: Payer: Self-pay | Admitting: Internal Medicine

## 2020-03-18 ENCOUNTER — Telehealth: Payer: Self-pay

## 2020-03-18 NOTE — Telephone Encounter (Signed)
-----   Message from Reubin Milan, MD sent at 03/13/2020  1:20 PM EST ----- Regarding: RE: CT chest Can you call this patient and see why he cancelled the CT scan?  ----- Message ----- From: Retta Diones Sent: 03/13/2020  10:52 AM EST To: Reubin Milan, MD Subject: RE: CT chest                                   I scheduled this patient for 03/06/20, that was changed to 03/08/20. Patient canceled the appointment  ----- Message ----- From: Reubin Milan, MD Sent: 03/12/2020   3:32 PM EST To: Retta Diones Subject: CT chest                                       Can you find out what is happening with this CT appointment?  It does not seem that anyone is calling the patient to schedule. Thanks.

## 2020-03-18 NOTE — Telephone Encounter (Signed)
Called patient to follow up on canceled CT Scan. He said he could not afford it. He was told he needed to pay $1,500 up front before he can have the CT done. He had to cancel this appt because of this.   Informed Dr. Judithann Graves verbally.

## 2020-03-18 NOTE — Telephone Encounter (Signed)
Pt reports seen in ED 03/16/20 'Cellulitis left leg.' States cause unknown, no wounds or injury. States this evening noted quarter size lump back of calf at area of cellulitis. States tender to touch and with walking. Advised ED. Pt states will follow disposition.   Reason for Disposition . [1] Swelling is red AND [2] size > 2 inches (5.0 cm) (Exception: itchy area of skin)    Back of calf, left leg with cellulitis.  Answer Assessment - Initial Assessment Questions 1. APPEARANCE of SWELLING: "What does it look like?" (e.g., lymph node, insect bite, mole)    Cellulitis left leg. Seen 03/16/20 in ED 2. SIZE: "How large is the swelling?" (inches, cm or compare to coins)     Quarter size 3. LOCATION: "Where is the swelling located?"     Behind knee, above calf 4. ONSET: "When did the swelling start?"     Noted this evening. 5. PAIN: "Is it painful?" If Yes, ask: "How much?"     Yes, 3-4/10. Pain with walking 6. ITCH: "Does it itch?" If Yes, ask: "How much?"     No 7. CAUSE: "What do you think caused the swelling?"     Unsure 8. OTHER SYMPTOMS: "Do you have any other symptoms?" (e.g., fever)     No  Protocols used: SKIN LUMP OR LOCALIZED SWELLING-A-AH

## 2020-03-19 ENCOUNTER — Telehealth: Payer: Self-pay

## 2020-03-19 NOTE — Telephone Encounter (Signed)
Pt advised to go to ED. ? ?KP ?

## 2020-03-19 NOTE — Telephone Encounter (Signed)
Called patient to follow up today and see how was feeling with his leg pain. He said he needed to go to UC this morning for his pain that was worsening behind his knee. He said he felt a lump last night behind his knee and was told by our after hours call center that he needed to go to the ER.  Went to [facility] today and received prednisone taper and doxycycline BID #20. Also they referred him to General Surgery and the note documented that they will call him to schedule an appointment to follow up with patient.   Told him to be sure to take medications as prescribed. Make sure to keep phone available for when they call to schedule. Call us if he needs anything further. He thanked me and told patient to have a good day.  Mariel Sleet, CMA (AAMA)

## 2021-02-28 ENCOUNTER — Ambulatory Visit (INDEPENDENT_AMBULATORY_CARE_PROVIDER_SITE_OTHER): Payer: 59 | Admitting: Internal Medicine

## 2021-02-28 ENCOUNTER — Encounter: Payer: Self-pay | Admitting: Internal Medicine

## 2021-02-28 ENCOUNTER — Other Ambulatory Visit: Payer: Self-pay

## 2021-02-28 VITALS — BP 118/78 | HR 76 | Ht 70.0 in | Wt 213.4 lb

## 2021-02-28 DIAGNOSIS — Z0000 Encounter for general adult medical examination without abnormal findings: Secondary | ICD-10-CM

## 2021-02-28 DIAGNOSIS — Z1211 Encounter for screening for malignant neoplasm of colon: Secondary | ICD-10-CM | POA: Diagnosis not present

## 2021-02-28 DIAGNOSIS — E782 Mixed hyperlipidemia: Secondary | ICD-10-CM

## 2021-02-28 DIAGNOSIS — Z125 Encounter for screening for malignant neoplasm of prostate: Secondary | ICD-10-CM

## 2021-02-28 DIAGNOSIS — F17200 Nicotine dependence, unspecified, uncomplicated: Secondary | ICD-10-CM

## 2021-02-28 DIAGNOSIS — R918 Other nonspecific abnormal finding of lung field: Secondary | ICD-10-CM

## 2021-02-28 NOTE — Progress Notes (Signed)
Date:  02/28/2021   Name:  Nathan Humphrey   DOB:  February 25, 1970   MRN:  [id number]   Chief Complaint: Annual Exam Nathan Humphrey is a 51 y.o. male who presents today for his Complete Annual Exam. He feels well. He reports exercising - working. He reports he is sleeping well.   Colonoscopy: none  Immunization History  Administered Date(s) Administered   Td 05/26/2010    No results found for: PSA1, PSA   HPI  Lab Results  Component Value Date   NA 142 02/21/2020   K 4.6 02/21/2020   CO2 25 02/21/2020   GLUCOSE 88 02/21/2020   BUN 17 02/21/2020   CREATININE 1.30 (H) 02/21/2020   CALCIUM 9.9 02/21/2020   GFRNONAA 65 02/21/2020   Lab Results  Component Value Date   CHOL 225 (H) 02/21/2020   HDL 33 (L) 02/21/2020   LDLCALC 155 (H) 02/21/2020   TRIG 202 (H) 02/21/2020   CHOLHDL 6.8 (H) 02/21/2020   No results found for: TSH No results found for: HGBA1C Lab Results  Component Value Date   WBC 7.6 02/21/2020   HGB 16.8 02/21/2020   HCT 50.6 02/21/2020   MCV 93 02/21/2020   PLT 179 02/21/2020   Lab Results  Component Value Date   ALT 28 02/21/2020   AST 22 02/21/2020   ALKPHOS 130 (H) 02/21/2020   BILITOT 0.5 02/21/2020   No results found for: 25OHVITD2, 25OHVITD3, VD25OH   Review of Systems  Constitutional:  Negative for appetite change, chills, diaphoresis, fatigue and unexpected weight change.  HENT:  Negative for hearing loss, tinnitus, trouble swallowing and voice change.   Eyes:  Negative for visual disturbance.  Respiratory:  Negative for choking, shortness of breath and wheezing.   Cardiovascular:  Negative for chest pain, palpitations and leg swelling.  Gastrointestinal:  Negative for abdominal pain, blood in stool, constipation and diarrhea.  Genitourinary:  Negative for difficulty urinating, dysuria and frequency.  Musculoskeletal:  Negative for arthralgias, back pain and myalgias.  Skin:  Negative for color change and rash.  Allergic/Immunologic:  Negative for environmental allergies and food allergies.  Neurological:  Negative for dizziness, syncope and headaches.  Hematological:  Negative for adenopathy.  Psychiatric/Behavioral:  Negative for dysphoric mood and sleep disturbance. The patient is not nervous/anxious.    Patient Active Problem List   Diagnosis Date Noted   Hyperlipidemia, mixed 08/03/2016   Pulmonary nodules/lesions, multiple 07/24/2016   Bilateral inguinal hernia without obstruction or gangrene 07/24/2016   Kidney stones 07/24/2016   Tobacco use disorder 07/24/2016    No Known Allergies  History reviewed. No pertinent surgical history.  Social History   Tobacco Use   Smoking status: Some Days    Types: Cigars   Smokeless tobacco: Never   Tobacco comments:    2-3 cigars per day only on weekends (inhaled)  Vaping Use   Vaping Use: Former  Substance Use Topics   Alcohol use: Yes    Comment: occasional.   Drug use: No     Medication list has been reviewed and updated.  Current Meds  Medication Sig   Aspirin-Acetaminophen-Caffeine (EXCEDRIN PO) Take by mouth as needed.   mometasone (ELOCON) 0.1 % cream SMARTSIG:Sparingly Topical Twice Daily    PHQ 2/9 Scores 02/28/2021 02/21/2020 03/08/2019 02/20/2019  PHQ - 2 Score 0 0 0 0  PHQ- 9 Score 0 0 0 0    GAD 7 : Generalized Anxiety Score 02/28/2021 02/21/2020  Nervous, Anxious, on Edge  0 0  Control/stop worrying 0 0  Worry too much - different things 0 0  Trouble relaxing 0 0  Restless 0 0  Easily annoyed or irritable 0 0  Afraid - awful might happen 0 0  Total GAD 7 Score 0 0  Anxiety Difficulty Not difficult at all -    BP Readings from Last 3 Encounters:  02/28/21 118/78  03/16/20 (!) 154/92  02/21/20 104/78    Physical Exam Vitals and nursing note reviewed.  Constitutional:      Appearance: Normal appearance. He is well-developed.  HENT:     Head: Normocephalic.     Right Ear: Tympanic membrane, ear canal and external ear normal.      Left Ear: Tympanic membrane, ear canal and external ear normal.     Nose: Nose normal.  Eyes:     Conjunctiva/sclera: Conjunctivae normal.     Pupils: Pupils are equal, round, and reactive to light.  Neck:     Thyroid: No thyromegaly.     Vascular: No carotid bruit.  Cardiovascular:     Rate and Rhythm: Normal rate and regular rhythm.     Pulses: Normal pulses.     Heart sounds: Normal heart sounds.  Pulmonary:     Effort: Pulmonary effort is normal.     Breath sounds: Normal breath sounds. No wheezing.  Chest:  Breasts:    Right: No mass.     Left: No mass.  Abdominal:     General: Bowel sounds are normal.     Palpations: Abdomen is soft.     Tenderness: There is no abdominal tenderness.  Musculoskeletal:        General: Normal range of motion.     Cervical back: Normal range of motion and neck supple.     Right lower leg: No edema.     Left lower leg: No edema.  Lymphadenopathy:     Cervical: No cervical adenopathy.  Skin:    General: Skin is warm and dry.     Capillary Refill: Capillary refill takes less than 2 seconds.  Neurological:     General: No focal deficit present.     Mental Status: He is alert and oriented to person, place, and time.     Deep Tendon Reflexes: Reflexes are normal and symmetric.  Psychiatric:        Attention and Perception: Attention normal.        Mood and Affect: Mood normal.        Thought Content: Thought content normal.    Wt Readings from Last 3 Encounters:  02/28/21 213 lb 6.4 oz (96.8 kg)  03/16/20 205 lb (93 kg)  02/21/20 209 lb (94.8 kg)    BP 118/78    Pulse 76    Ht 5\' 10"  (1.778 m)    Wt 213 lb 6.4 oz (96.8 kg)    SpO2 98%    BMI 30.62 kg/m   Assessment and Plan: 1. Annual physical exam Normal exam except for slight weight gain.  Recommend healthy diet and exercise. He declines flu and Covid vaccines. - Comprehensive metabolic panel  2. Colon cancer screening Will plan to do at age 77.  62. Hyperlipidemia,  mixed Family hx of CAD.  Will check labs and advise. - Lipid panel  4. Tobacco use disorder Continue to cut back on cigars. No chest pains or SOB noted.  5. Pulmonary nodules/lesions, multiple Seen on CT in 2018 (one stable old nodule and 2 new nodules) but  the scan was not a chest CT.   Attempted to repeat CT last year but pt was asked to pay $1500. He denies chest symptoms, CP, SOB, cough.  Will monitor sx and exam and pursue if needed. - CBC with Differential/Platelet  6. Prostate cancer screening DRE deferred. - PSA   Partially dictated using Editor, commissioning. Any errors are unintentional.  Halina Maidens, MD [facility]  02/28/2021

## 2021-03-01 LAB — CBC WITH DIFFERENTIAL/PLATELET
Basophils Absolute: 0.1 10*3/uL (ref 0.0–0.2)
Basos: 1 %
EOS (ABSOLUTE): 0.3 10*3/uL (ref 0.0–0.4)
Eos: 4 %
Hematocrit: 49.5 % (ref 37.5–51.0)
Hemoglobin: 17.1 g/dL (ref 13.0–17.7)
Immature Grans (Abs): 0 10*3/uL (ref 0.0–0.1)
Immature Granulocytes: 1 %
Lymphocytes Absolute: 2.6 10*3/uL (ref 0.7–3.1)
Lymphs: 34 %
MCH: 31.9 pg (ref 26.6–33.0)
MCHC: 34.5 g/dL (ref 31.5–35.7)
MCV: 92 fL (ref 79–97)
Monocytes Absolute: 0.9 10*3/uL (ref 0.1–0.9)
Monocytes: 12 %
Neutrophils Absolute: 3.9 10*3/uL (ref 1.4–7.0)
Neutrophils: 48 %
Platelets: 189 10*3/uL (ref 150–450)
RBC: 5.36 x10E6/uL (ref 4.14–5.80)
RDW: 12.5 % (ref 11.6–15.4)
WBC: 7.8 10*3/uL (ref 3.4–10.8)

## 2021-03-01 LAB — COMPREHENSIVE METABOLIC PANEL
ALT: 43 IU/L (ref 0–44)
AST: 25 IU/L (ref 0–40)
Albumin/Globulin Ratio: 1.8 (ref 1.2–2.2)
Albumin: 4.5 g/dL (ref 4.0–5.0)
Alkaline Phosphatase: 125 IU/L — ABNORMAL HIGH (ref 44–121)
BUN/Creatinine Ratio: 13 (ref 9–20)
BUN: 17 mg/dL (ref 6–24)
Bilirubin Total: 0.5 mg/dL (ref 0.0–1.2)
CO2: 23 mmol/L (ref 20–29)
Calcium: 9.9 mg/dL (ref 8.7–10.2)
Chloride: 102 mmol/L (ref 96–106)
Creatinine, Ser: 1.3 mg/dL — ABNORMAL HIGH (ref 0.76–1.27)
Globulin, Total: 2.5 g/dL (ref 1.5–4.5)
Glucose: 98 mg/dL (ref 70–99)
Potassium: 5 mmol/L (ref 3.5–5.2)
Sodium: 140 mmol/L (ref 134–144)
Total Protein: 7 g/dL (ref 6.0–8.5)
eGFR: 68 mL/min/{1.73_m2} (ref >59)

## 2021-03-01 LAB — LIPID PANEL
Chol/HDL Ratio: 6.6 ratio — ABNORMAL HIGH (ref 0.0–5.0)
Cholesterol, Total: 226 mg/dL — ABNORMAL HIGH (ref 100–199)
HDL: 34 mg/dL — ABNORMAL LOW (ref >39)
LDL Chol Calc (NIH): 146 mg/dL — ABNORMAL HIGH (ref 0–99)
Triglycerides: 249 mg/dL — ABNORMAL HIGH (ref 0–149)
VLDL Cholesterol Cal: 46 mg/dL — ABNORMAL HIGH (ref 5–40)

## 2021-03-01 LAB — PSA: Prostate Specific Ag, Serum: 0.4 ng/mL (ref 0.0–4.0)

## 2021-03-10 ENCOUNTER — Telehealth: Payer: Self-pay

## 2021-03-10 ENCOUNTER — Other Ambulatory Visit: Payer: Self-pay | Admitting: Internal Medicine

## 2021-03-10 DIAGNOSIS — E782 Mixed hyperlipidemia: Secondary | ICD-10-CM

## 2021-03-10 MED ORDER — ATORVASTATIN CALCIUM 10 MG PO TABS: 10.0000 mg | ORAL_TABLET | Freq: Every day | ORAL | 1 refills | Status: DC

## 2021-03-10 NOTE — Telephone Encounter (Signed)
Please call pt to schedule follow up appt for cholesterol. Pt needs to fast for this appointment.  KP

## 2021-03-10 NOTE — Telephone Encounter (Signed)
Pt called in and stated he would like to start cholesterol medication at this time. Per last labs it was recommended that pt start medication. Pt would like medication sent to [facility] in [place].  KP

## 2021-03-10 NOTE — Telephone Encounter (Signed)
Copied from CRM [id number]. Topic: General - Other >> Mar 10, 2021 12:48 PM Herby Abraham C wrote: Reason for CRM: pt called in for assistance. Pt says that he was seen and suggested medication by the provider. Pt says that provider never said a name but it is to help with concern in visit. Pt says in visit, he declined the medication. Pt says that he would like to go ahead with whatever medication provider suggests.    Pharmacy: [id number] [place], [place] - [address]  Phone:  [phone] Fax:  [phone]

## 2021-03-14 ENCOUNTER — Ambulatory Visit: Payer: 59 | Admitting: Internal Medicine

## 2021-09-20 ENCOUNTER — Other Ambulatory Visit: Payer: Self-pay

## 2021-09-20 ENCOUNTER — Emergency Department (HOSPITAL_BASED_OUTPATIENT_CLINIC_OR_DEPARTMENT_OTHER): Payer: 59

## 2021-09-20 ENCOUNTER — Emergency Department (HOSPITAL_BASED_OUTPATIENT_CLINIC_OR_DEPARTMENT_OTHER)
Admission: EM | Admit: 2021-09-20 | Discharge: 2021-09-20 | Disposition: A | Discharge status: Home / Self Care | Payer: 59 | Attending: Emergency Medicine | Admitting: Emergency Medicine

## 2021-09-20 ENCOUNTER — Encounter (HOSPITAL_BASED_OUTPATIENT_CLINIC_OR_DEPARTMENT_OTHER): Payer: Self-pay

## 2021-09-20 DIAGNOSIS — M545 Low back pain, unspecified: Secondary | ICD-10-CM | POA: Insufficient documentation

## 2021-09-20 DIAGNOSIS — Z7982 Long term (current) use of aspirin: Secondary | ICD-10-CM | POA: Insufficient documentation

## 2021-09-20 DIAGNOSIS — R109 Unspecified abdominal pain: Secondary | ICD-10-CM | POA: Insufficient documentation

## 2021-09-20 LAB — URINALYSIS, ROUTINE W REFLEX MICROSCOPIC
Bilirubin Urine: NEGATIVE
Glucose, UA: NEGATIVE mg/dL
Hgb urine dipstick: NEGATIVE
Ketones, ur: NEGATIVE mg/dL
Leukocytes,Ua: NEGATIVE
Nitrite: NEGATIVE
Protein, ur: NEGATIVE mg/dL
Specific Gravity, Urine: 1.02 (ref 1.005–1.030)
pH: 7.5 (ref 5.0–8.0)

## 2021-09-20 MED ORDER — METHOCARBAMOL 500 MG PO TABS: 500.0000 mg | ORAL_TABLET | Freq: Two times a day (BID) | ORAL | 0 refills | Status: DC | PRN

## 2021-09-20 NOTE — Discharge Instructions (Signed)
You are seen in the emergency department for a 3-year 4 days of low back pain.  Your urinalysis did not show any signs of blood or infection.  Your CT did show some stones up in the kidney but no stones in the tube going from the kidney to the bladder.  You are having most likely musculoskeletal back pain.  Please use Tylenol and ibuprofen as needed for pain.  Follow-up with your doctor.  Return to the emergency department if any worsening or concerning symptoms.  We are prescribing you a muscle relaxer which may help with your symptoms.

## 2021-09-20 NOTE — ED Triage Notes (Signed)
He tells me he has had some nagging left flank pain x several days. He had initially thought that he "pulled a back muscle", however, as time goes on he recognizes this pain as very similar to pain he had had in the past with kidney stones.

## 2021-09-20 NOTE — ED Notes (Signed)
Urine Sent to lab

## 2021-09-20 NOTE — ED Provider Notes (Signed)
[facility] EMERGENCY DEPT Provider Note   CSN: [id number] Arrival date & time: 09/20/21  1330     History  Chief Complaint  Patient presents with   Flank Pain    Nathan Humphrey is a 51 y.o. male.  He is here with a complaint of lower left back pain that started 3 to 4 days ago.  Rates it as 8 out of 10 worse with movement and bending.  Not associated with any urinary symptoms or abdominal pain no numbness or weakness.  He said he initially thought it was a pulled muscle from work but now that he thinks about it more he does state it feels kind of like a kidney stone he had in the past.  No fevers or chills.  The history is provided by the patient.  Flank Pain This is a new problem. The current episode started more than 2 days ago. The problem occurs constantly. The problem has not changed since onset.Pertinent negatives include no chest pain, no abdominal pain, no headaches and no shortness of breath. The symptoms are aggravated by bending and twisting. Nothing relieves the symptoms. He has tried rest for the symptoms. The treatment provided no relief.       Home Medications Prior to Admission medications   Medication Sig Start Date End Date Taking? Authorizing Provider  Aspirin-Acetaminophen-Caffeine (EXCEDRIN PO) Take by mouth as needed.    [provider]  atorvastatin (LIPITOR) 10 MG tablet Take 1 tablet (10 mg total) by mouth daily. 03/10/21   Reubin Milan, MD  mometasone (ELOCON) 0.1 % cream SMARTSIG:Sparingly Topical Twice Daily 11/01/20   [provider]      Allergies    Patient has no known allergies.    Review of Systems   Review of Systems  Respiratory:  Negative for shortness of breath.   Cardiovascular:  Negative for chest pain.  Gastrointestinal:  Negative for abdominal pain.  Genitourinary:  Positive for flank pain. Negative for dysuria and hematuria.  Musculoskeletal:  Positive for back pain.  Neurological:  Negative for  weakness, numbness and headaches.    Physical Exam Updated Vital Signs BP 124/77 (BP Location: Right Arm)   Pulse (!) 103   Temp 98.3 F (36.8 C)   Resp 18   SpO2 97%  Physical Exam Vitals and nursing note reviewed.  Constitutional:      General: He is not in acute distress.    Appearance: Normal appearance. He is well-developed.  HENT:     Head: Normocephalic and atraumatic.  Eyes:     Conjunctiva/sclera: Conjunctivae normal.  Cardiovascular:     Rate and Rhythm: Normal rate and regular rhythm.     Heart sounds: No murmur heard. Pulmonary:     Effort: Pulmonary effort is normal. No respiratory distress.     Breath sounds: Normal breath sounds.  Abdominal:     Palpations: Abdomen is soft.     Tenderness: There is no abdominal tenderness. There is no guarding or rebound.  Musculoskeletal:        General: Tenderness present.     Cervical back: Neck supple.     Comments: He has minimal lower lumbar and left paralumbar tenderness.  No CVA tenderness  Skin:    General: Skin is warm and dry.     Capillary Refill: Capillary refill takes less than 2 seconds.  Neurological:     General: No focal deficit present.     Mental Status: He is alert.  Sensory: No sensory deficit.     Motor: No weakness.     Gait: Gait normal.  Psychiatric:        Mood and Affect: Mood normal.     ED Results / Procedures / Treatments   Labs (all labs ordered are listed, but only abnormal results are displayed) Labs Reviewed  URINALYSIS, ROUTINE W REFLEX MICROSCOPIC - Abnormal; Notable for the following components:      Result Value   APPearance HAZY (*)    All other components within normal limits    EKG None  Radiology CT Renal Stone Study  Result Date: 09/20/2021 CLINICAL DATA:  Several days of left flank pain. EXAM: CT ABDOMEN AND PELVIS WITHOUT CONTRAST TECHNIQUE: Multidetector CT imaging of the abdomen and pelvis was performed following the standard protocol without IV contrast.  RADIATION DOSE REDUCTION: This exam was performed according to the departmental dose-optimization program which includes automated exposure control, adjustment of the mA and/or kV according to patient size and/or use of iterative reconstruction technique. COMPARISON:  CT July 14, 2020 FINDINGS: Lower chest: No acute abnormality. Hepatobiliary: Diffusely decreased density of the hepatic parenchyma with slight heterogeneity in its appearance common nonspecific but similar prior commonly reflecting geographic hepatic steatosis. Gallbladder is unremarkable. No biliary ductal dilation. Pancreas: No pancreatic ductal dilation or evidence of acute inflammation. Spleen: No splenomegaly. Adrenals/Urinary Tract: Bilateral adrenal glands appear normal. No hydronephrosis. Nonobstructive punctate bilateral renal stones measure up to 2 mm. No obstructive ureteral or bladder calculus identified. Urinary bladder is nondistended limiting evaluation. Stomach/Bowel: No radiopaque enteric contrast material was administered. Stomach is unremarkable for degree of distension. No pathologic dilation of small or large bowel. The appendix and terminal ileum appear normal. No evidence of acute bowel inflammation. Vascular/Lymphatic: Aortic and branch vessel atherosclerosis. No pathologically enlarged abdominal or pelvic lymph nodes. Reproductive: Dystrophic calcifications in the prostate gland. Other: No significant abdominopelvic free fluid. Musculoskeletal: No acute osseous abnormality. IMPRESSION: 1. Tiny bilateral nonobstructive renal stones. 2. No obstructive ureteral or bladder calculi identified. Electronically Signed   By: Maudry Mayhew M.D.   On: 09/20/2021 14:39    Procedures Procedures    Medications Ordered in ED Medications - No data to display  ED Course/ Medical Decision Making/ A&P                           Medical Decision Making Amount and/or Complexity of Data Reviewed Labs: ordered. Radiology:  ordered.  Risk Prescription drug management.   51 year old male here with left-sided low back pain.  He thinks it might be skeletal but also worried it might be a kidney stone.  Other etiologies include disc disease, AAA, epidural abscess.  Patient has no red flags neuro intact.  Urinalysis without any hematuria or signs of infection, CT renal does not show any ureteral stone.  Patient will continue anti-inflammatories and follow-up with PCP.  Return instructions discussed        Final Clinical Impression(s) / ED Diagnoses Final diagnoses:  Acute left-sided low back pain without sciatica    Rx / DC Orders ED Discharge Orders          Ordered    methocarbamol (ROBAXIN) 500 MG tablet  2 times daily PRN        09/20/21 1445              Terrilee Files, MD 09/20/21 1657

## 2021-09-26 ENCOUNTER — Ambulatory Visit: Payer: 59 | Admitting: Internal Medicine

## 2022-03-03 ENCOUNTER — Encounter: Payer: Self-pay | Admitting: Internal Medicine

## 2022-03-03 ENCOUNTER — Ambulatory Visit (INDEPENDENT_AMBULATORY_CARE_PROVIDER_SITE_OTHER): Payer: 59 | Admitting: Internal Medicine

## 2022-03-03 VITALS — BP 125/82 | HR 76 | Ht 70.0 in | Wt 213.0 lb

## 2022-03-03 DIAGNOSIS — Z1211 Encounter for screening for malignant neoplasm of colon: Secondary | ICD-10-CM | POA: Diagnosis not present

## 2022-03-03 DIAGNOSIS — E782 Mixed hyperlipidemia: Secondary | ICD-10-CM | POA: Diagnosis not present

## 2022-03-03 DIAGNOSIS — Z0000 Encounter for general adult medical examination without abnormal findings: Secondary | ICD-10-CM | POA: Diagnosis not present

## 2022-03-03 DIAGNOSIS — Z131 Encounter for screening for diabetes mellitus: Secondary | ICD-10-CM

## 2022-03-03 DIAGNOSIS — Z125 Encounter for screening for malignant neoplasm of prostate: Secondary | ICD-10-CM | POA: Diagnosis not present

## 2022-03-03 NOTE — Progress Notes (Signed)
Date:  03/03/2022   Name:  Nathan Humphrey   DOB:  [birth year]   MRN:  [id number]   Chief Complaint: Annual Exam (Wellness check ) and Hyperlipidemia (10 months not taking ) Nathan Humphrey is a 52 y.o. male who presents today for his Complete Wellness Exam. He feels well. He reports exercising walking at work. He reports he is sleeping well.   Colonoscopy: none  Immunization History  Administered Date(s) Administered   Td 05/26/2010   Health Maintenance Due  Topic Date Due   Pneumococcal Vaccine 49-71 Years old (1 - PCV) Never done   COLONOSCOPY (Pts 45-37yrs Insurance coverage will need to be confirmed)  Never done   DTaP/Tdap/Td (2 - Tdap) 05/25/2020    Lab Results  Component Value Date   PSA1 0.4 02/28/2021     Hyperlipidemia This is a chronic problem. The problem is uncontrolled. Recent lipid tests were reviewed and are high. Pertinent negatives include no chest pain, myalgias or shortness of breath. He is currently on no antihyperlipidemic treatment (recommended meds last year but he can not remember to take them - last was 2 mo ago).    Lab Results  Component Value Date   NA 140 02/28/2021   K 5.0 02/28/2021   CO2 23 02/28/2021   GLUCOSE 98 02/28/2021   BUN 17 02/28/2021   CREATININE 1.30 (H) 02/28/2021   CALCIUM 9.9 02/28/2021   EGFR 68 02/28/2021   GFRNONAA 65 02/21/2020   Lab Results  Component Value Date   CHOL 226 (H) 02/28/2021   HDL 34 (L) 02/28/2021   LDLCALC 146 (H) 02/28/2021   TRIG 249 (H) 02/28/2021   CHOLHDL 6.6 (H) 02/28/2021   No results found for: "TSH" No results found for: "HGBA1C" Lab Results  Component Value Date   WBC 7.8 02/28/2021   HGB 17.1 02/28/2021   HCT 49.5 02/28/2021   MCV 92 02/28/2021   PLT 189 02/28/2021   Lab Results  Component Value Date   ALT 43 02/28/2021   AST 25 02/28/2021   ALKPHOS 125 (H) 02/28/2021   BILITOT 0.5 02/28/2021   No results found for: "25OHVITD2", "25OHVITD3", "VD25OH"   Review of Systems   Constitutional:  Negative for appetite change, chills, diaphoresis, fatigue and unexpected weight change.  HENT:  Negative for hearing loss, tinnitus, trouble swallowing and voice change.   Eyes:  Negative for visual disturbance.  Respiratory:  Negative for choking, shortness of breath and wheezing.   Cardiovascular:  Negative for chest pain, palpitations and leg swelling.  Gastrointestinal:  Negative for abdominal pain, blood in stool, constipation and diarrhea.  Genitourinary:  Negative for difficulty urinating, dysuria and frequency.  Musculoskeletal:  Negative for arthralgias, back pain and myalgias.  Skin:  Positive for color change (very dry skin per Derm). Negative for rash.  Neurological:  Negative for dizziness, syncope and headaches.  Hematological:  Negative for adenopathy.  Psychiatric/Behavioral:  Negative for dysphoric mood and sleep disturbance. The patient is not nervous/anxious.     Patient Active Problem List   Diagnosis Date Noted   Hyperlipidemia, mixed 08/03/2016   Pulmonary nodules/lesions, multiple 07/24/2016   Bilateral inguinal hernia without obstruction or gangrene 07/24/2016   Kidney stones 07/24/2016   Tobacco use disorder 07/24/2016    No Known Allergies  History reviewed. No pertinent surgical history.  Social History   Tobacco Use   Smoking status: Some Days    Types: Cigars   Smokeless tobacco: Never   Tobacco comments:  2-3 cigars per day only on weekends (inhaled)  Vaping Use   Vaping Use: Former  Substance Use Topics   Alcohol use: Yes    Comment: occasional.   Drug use: No     Medication list has been reviewed and updated.  Current Meds  Medication Sig   Aspirin-Acetaminophen-Caffeine (EXCEDRIN PO) Take by mouth as needed.   augmented betamethasone dipropionate (DIPROLENE-AF) 0.05 % ointment Apply topically 2 (two) times daily.   [DISCONTINUED] atorvastatin (LIPITOR) 10 MG tablet Take 1 tablet (10 mg total) by mouth daily.        03/03/2022    8:51 AM 02/28/2021    8:04 AM 02/21/2020    8:08 AM  GAD 7 : Generalized Anxiety Score  Nervous, Anxious, on Edge 0 0 0  Control/stop worrying 0 0 0  Worry too much - different things 0 0 0  Trouble relaxing 0 0 0  Restless 0 0 0  Easily annoyed or irritable 0 0 0  Afraid - awful might happen 0 0 0  Total GAD 7 Score 0 0 0  Anxiety Difficulty Not difficult at all Not difficult at all        03/03/2022    8:50 AM 02/28/2021    8:03 AM 02/21/2020    8:08 AM  Depression screen PHQ 2/9  Decreased Interest 0 0 0  Down, Depressed, Hopeless 0 0 0  PHQ - 2 Score 0 0 0  Altered sleeping 0 0 0  Tired, decreased energy 0 0 0  Change in appetite 0 0 0  Feeling bad or failure about yourself  0 0 0  Trouble concentrating 0 0 0  Moving slowly or fidgety/restless 0 0 0  Suicidal thoughts 0 0 0  PHQ-9 Score 0 0 0  Difficult doing work/chores Not difficult at all Not difficult at all     BP Readings from Last 3 Encounters:  03/03/22 125/82  09/20/21 (!) 152/96  02/28/21 118/78    Physical Exam Vitals and nursing note reviewed.  Constitutional:      Appearance: Normal appearance. He is well-developed.  HENT:     Head: Normocephalic.     Right Ear: Tympanic membrane, ear canal and external ear normal.     Left Ear: Tympanic membrane, ear canal and external ear normal.     Nose: Nose normal.  Eyes:     Conjunctiva/sclera: Conjunctivae normal.     Pupils: Pupils are equal, round, and reactive to light.  Neck:     Thyroid: No thyromegaly.     Vascular: No carotid bruit.  Cardiovascular:     Rate and Rhythm: Normal rate and regular rhythm.     Heart sounds: Normal heart sounds.  Pulmonary:     Effort: Pulmonary effort is normal.     Breath sounds: Normal breath sounds. No wheezing.  Chest:  Breasts:    Right: No mass.     Left: No mass.  Abdominal:     General: Bowel sounds are normal.     Palpations: Abdomen is soft.     Tenderness: There is no  abdominal tenderness.  Musculoskeletal:        General: Normal range of motion.     Cervical back: Normal range of motion and neck supple.     Right lower leg: No edema.     Left lower leg: No edema.  Lymphadenopathy:     Cervical: No cervical adenopathy.  Skin:    General: Skin is warm and dry.  Capillary Refill: Capillary refill takes less than 2 seconds.  Neurological:     General: No focal deficit present.     Mental Status: He is alert and oriented to person, place, and time.     Deep Tendon Reflexes: Reflexes are normal and symmetric.  Psychiatric:        Attention and Perception: Attention normal.        Mood and Affect: Mood normal.        Thought Content: Thought content normal.     Wt Readings from Last 3 Encounters:  03/03/22 213 lb (96.6 kg)  02/28/21 213 lb 6.4 oz (96.8 kg)  03/16/20 205 lb (93 kg)    BP 125/82 (BP Location: Right Arm, Cuff Size: Large)   Pulse 76   Ht 5\' 10"  (1.778 m)   Wt 213 lb (96.6 kg)   SpO2 96%   BMI 30.56 kg/m   Assessment and Plan: Problem List Items Addressed This Visit       Other   Hyperlipidemia, mixed   Relevant Orders   Lipid panel   Other Visit Diagnoses     Annual physical exam    -  Primary   Annual wellness visit doing well no immunizations needed; refer for colonoscopy he may schedule his own vasectomy consultation with Urology   Relevant Orders   CBC with Differential/Platelet   Comprehensive metabolic panel   Hemoglobin A1c   Lipid panel   PSA   Prostate cancer screening       Relevant Orders   PSA   Colon cancer screening       Relevant Orders   Ambulatory referral to Gastroenterology   Screening for diabetes mellitus       Relevant Orders   Comprehensive metabolic panel   Hemoglobin A1c        Partially dictated using Dragon software. Any errors are unintentional.  Halina Maidens, MD [facility]  03/03/2022

## 2022-03-03 NOTE — Patient Instructions (Signed)
Call [facility] to discuss vasectomy.

## 2022-03-04 ENCOUNTER — Other Ambulatory Visit: Payer: Self-pay | Admitting: Internal Medicine

## 2022-03-04 DIAGNOSIS — E782 Mixed hyperlipidemia: Secondary | ICD-10-CM

## 2022-03-04 LAB — CBC WITH DIFFERENTIAL/PLATELET
Basophils Absolute: 0.1 10*3/uL (ref 0.0–0.2)
Basos: 1 %
EOS (ABSOLUTE): 0.2 10*3/uL (ref 0.0–0.4)
Eos: 2 %
Hematocrit: 52.5 % — ABNORMAL HIGH (ref 37.5–51.0)
Hemoglobin: 17.6 g/dL (ref 13.0–17.7)
Immature Grans (Abs): 0.1 10*3/uL (ref 0.0–0.1)
Immature Granulocytes: 1 %
Lymphocytes Absolute: 2.3 10*3/uL (ref 0.7–3.1)
Lymphs: 28 %
MCH: 30.9 pg (ref 26.6–33.0)
MCHC: 33.5 g/dL (ref 31.5–35.7)
MCV: 92 fL (ref 79–97)
Monocytes Absolute: 0.9 10*3/uL (ref 0.1–0.9)
Monocytes: 11 %
Neutrophils Absolute: 4.6 10*3/uL (ref 1.4–7.0)
Neutrophils: 57 %
Platelets: 196 10*3/uL (ref 150–450)
RBC: 5.69 x10E6/uL (ref 4.14–5.80)
RDW: 12.4 % (ref 11.6–15.4)
WBC: 8 10*3/uL (ref 3.4–10.8)

## 2022-03-04 LAB — LIPID PANEL
Chol/HDL Ratio: 6.4 ratio — ABNORMAL HIGH (ref 0.0–5.0)
Cholesterol, Total: 236 mg/dL — ABNORMAL HIGH (ref 100–199)
HDL: 37 mg/dL — ABNORMAL LOW (ref >39)
LDL Chol Calc (NIH): 151 mg/dL — ABNORMAL HIGH (ref 0–99)
Triglycerides: 258 mg/dL — ABNORMAL HIGH (ref 0–149)
VLDL Cholesterol Cal: 48 mg/dL — ABNORMAL HIGH (ref 5–40)

## 2022-03-04 LAB — COMPREHENSIVE METABOLIC PANEL
ALT: 43 IU/L (ref 0–44)
AST: 30 IU/L (ref 0–40)
Albumin/Globulin Ratio: 1.9 (ref 1.2–2.2)
Albumin: 4.8 g/dL (ref 4.1–5.1)
Alkaline Phosphatase: 127 IU/L — ABNORMAL HIGH (ref 44–121)
BUN/Creatinine Ratio: 13 (ref 9–20)
BUN: 16 mg/dL (ref 6–24)
Bilirubin Total: 0.6 mg/dL (ref 0.0–1.2)
CO2: 23 mmol/L (ref 20–29)
Calcium: 10.3 mg/dL — ABNORMAL HIGH (ref 8.7–10.2)
Chloride: 99 mmol/L (ref 96–106)
Creatinine, Ser: 1.25 mg/dL (ref 0.76–1.27)
Globulin, Total: 2.5 g/dL (ref 1.5–4.5)
Glucose: 109 mg/dL — ABNORMAL HIGH (ref 70–99)
Potassium: 5 mmol/L (ref 3.5–5.2)
Sodium: 138 mmol/L (ref 134–144)
Total Protein: 7.3 g/dL (ref 6.0–8.5)
eGFR: 71 mL/min/{1.73_m2} (ref >59)

## 2022-03-04 LAB — PSA: Prostate Specific Ag, Serum: 0.7 ng/mL (ref 0.0–4.0)

## 2022-03-04 LAB — HEMOGLOBIN A1C
Est. average glucose Bld gHb Est-mCnc: 117 mg/dL
Hgb A1c MFr Bld: 5.7 % — ABNORMAL HIGH (ref 4.8–5.6)

## 2022-03-04 MED ORDER — ATORVASTATIN CALCIUM 10 MG PO TABS: 10.0000 mg | ORAL_TABLET | Freq: Every day | ORAL | 1 refills | Status: DC

## 2022-06-11 IMAGING — US US EXTREM LOW VENOUS*L*
1 series · 14 of 24 positions shown · non-contrast
Comparison: None.

CLINICAL DATA: Left leg pain and tenderness to touch.

EXAM:
LEFT LOWER EXTREMITY VENOUS DOPPLER ULTRASOUND
TECHNIQUE: Gray-scale sonography with compression, as well as color and duplex
ultrasound, were performed to evaluate the deep venous system(s)
from the level of the common femoral vein through the popliteal and
proximal calf veins.

[Series 1: us venous img lower uni left (dvt) · portal-venous · 14 of 35 slices shown]
[im 1/35]
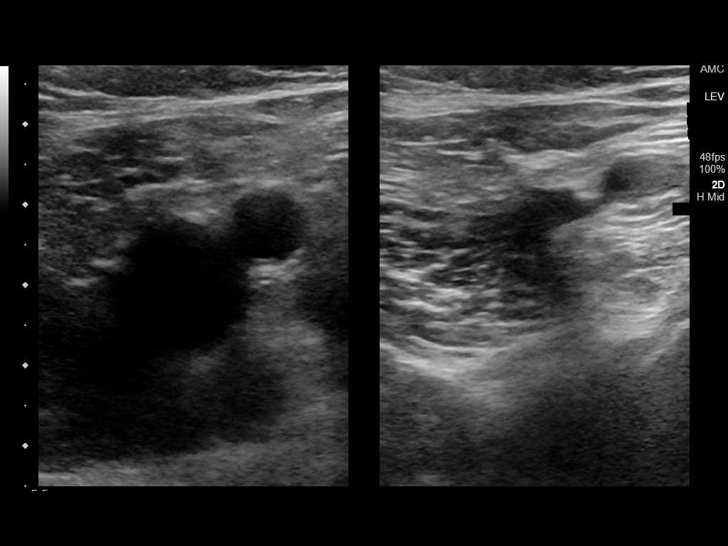
[im 3/35]
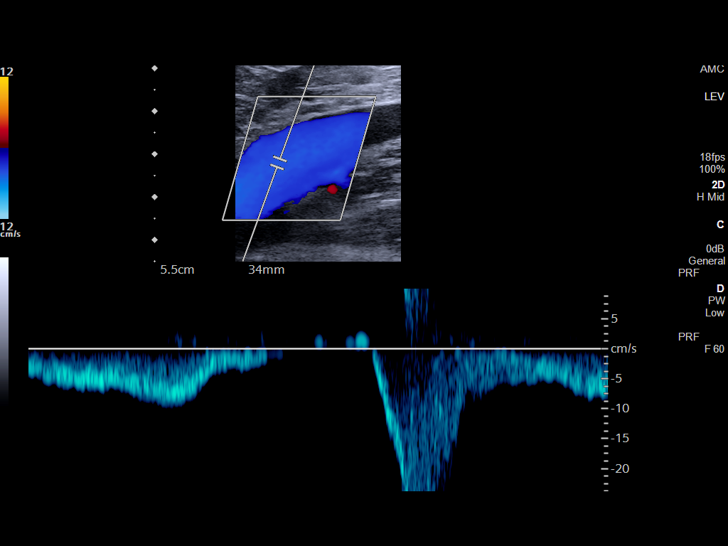
[im 6/35]
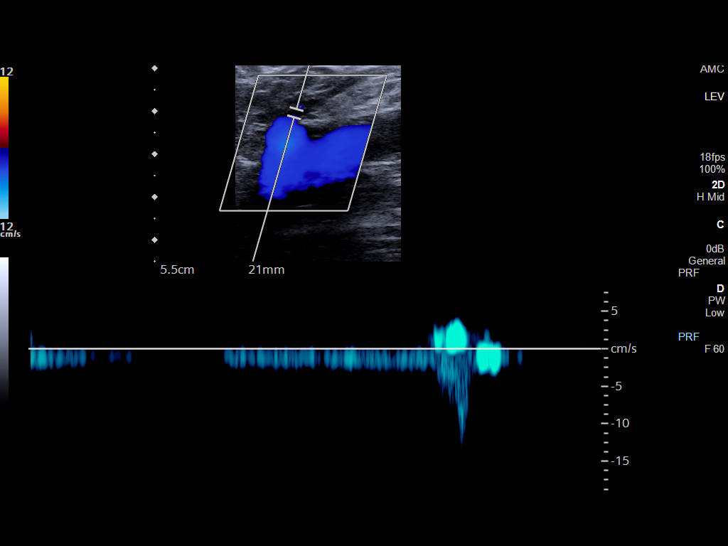
[im 9/35]
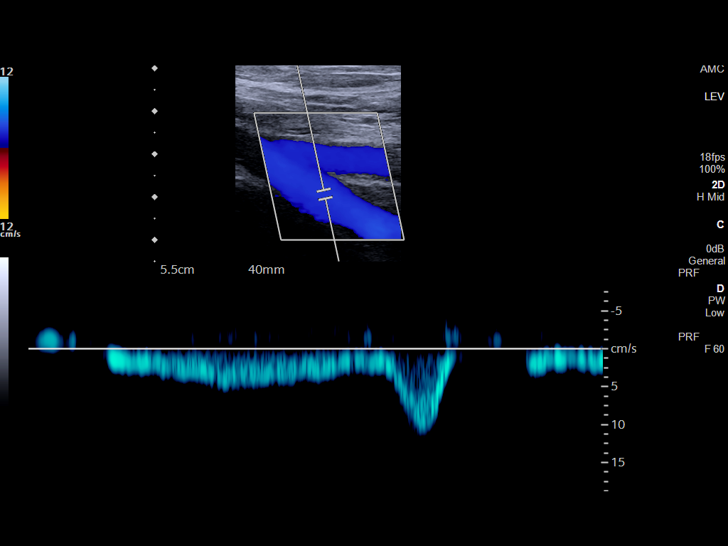
[im 11/35]
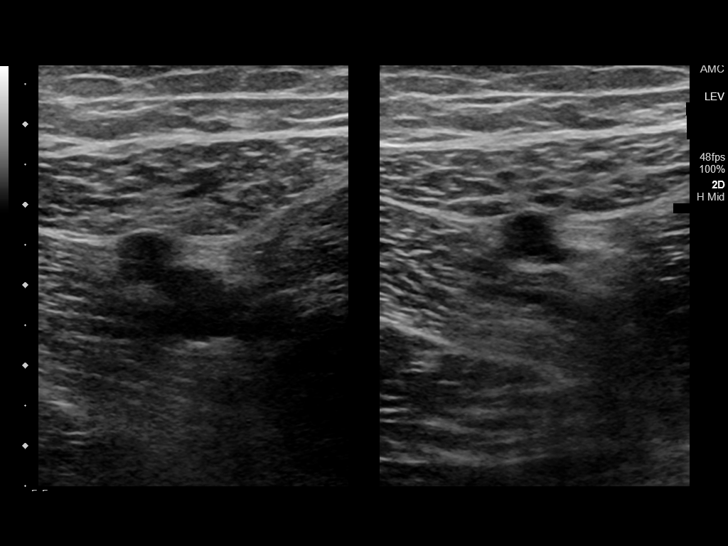
[im 14/35]
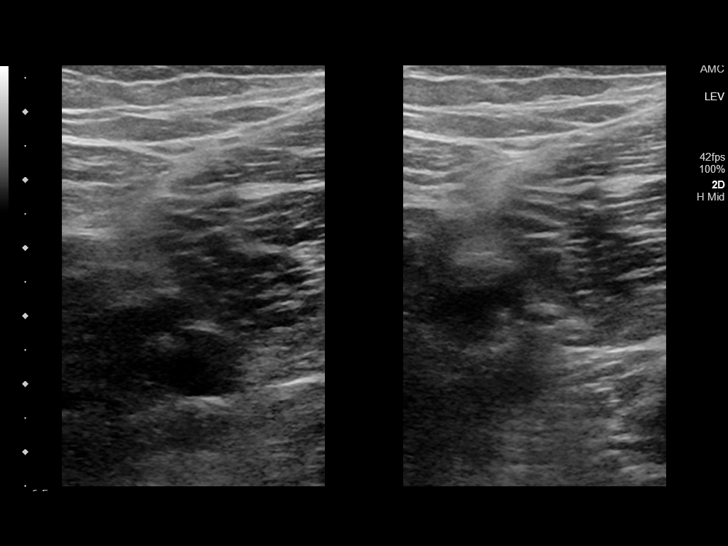
[im 17/35]
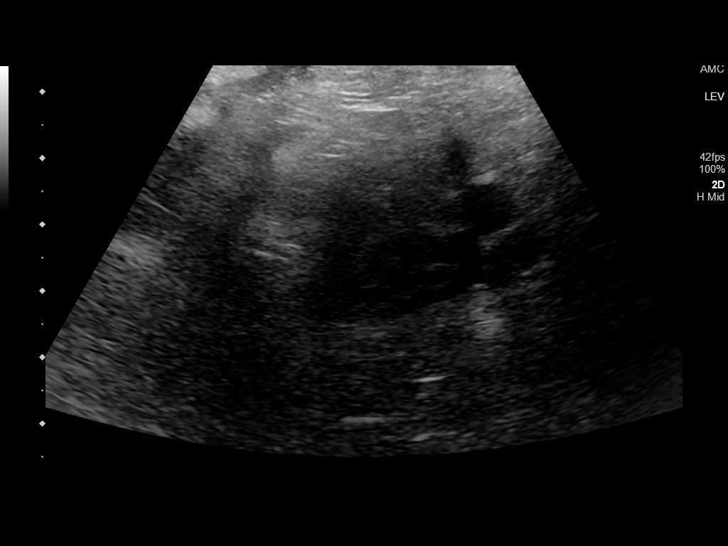
[im 18/35]
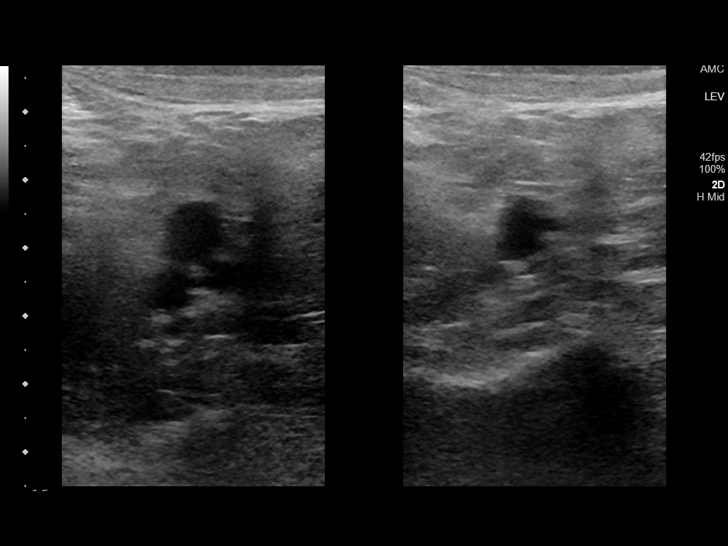
[im 21/35]
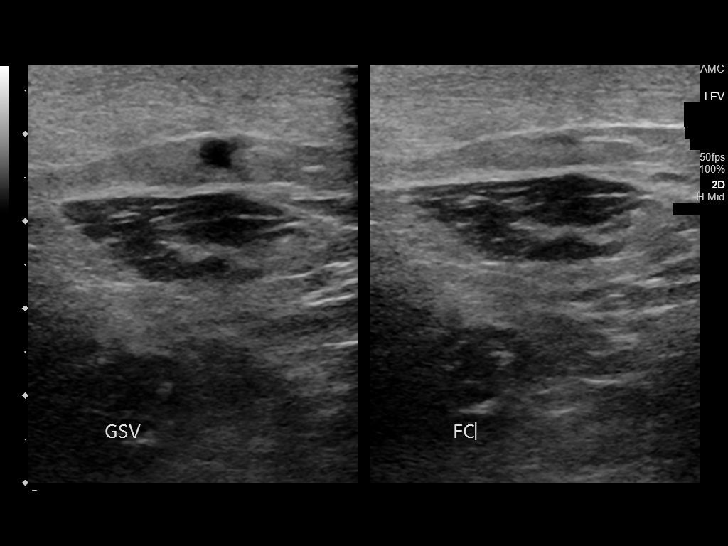
[im 24/35]
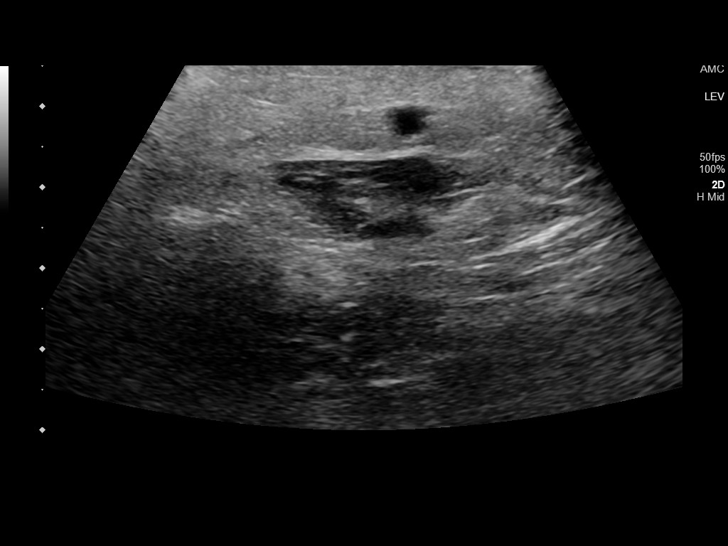
[im 27/35]
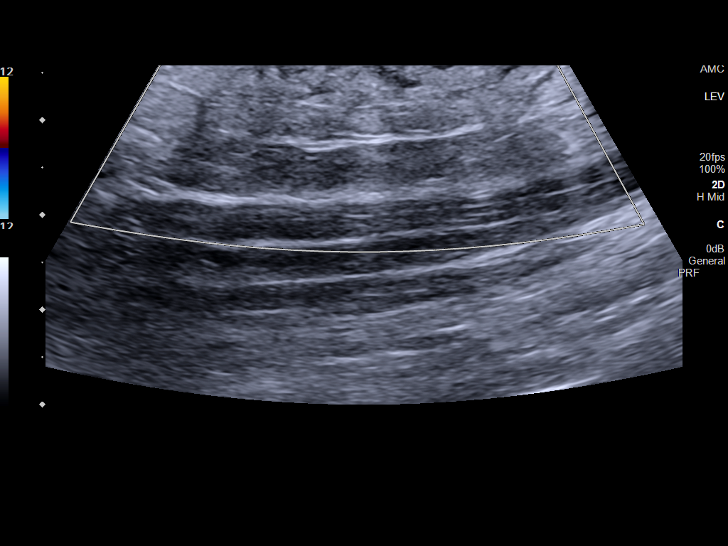
[im 29/35]
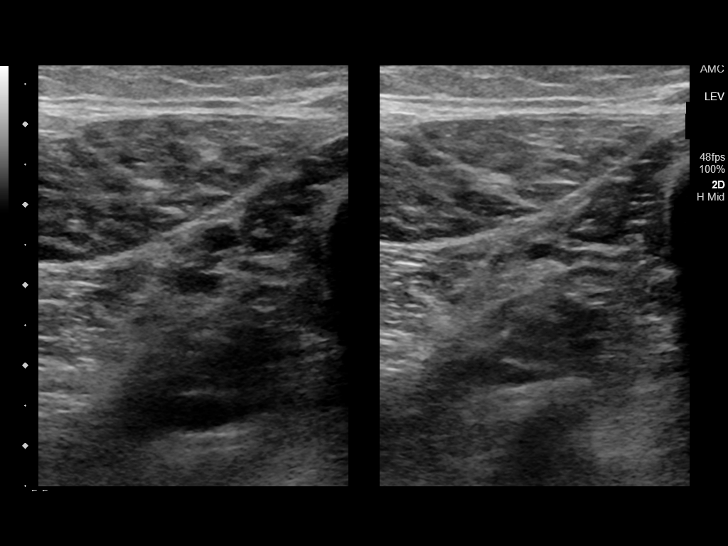
[im 32/35]
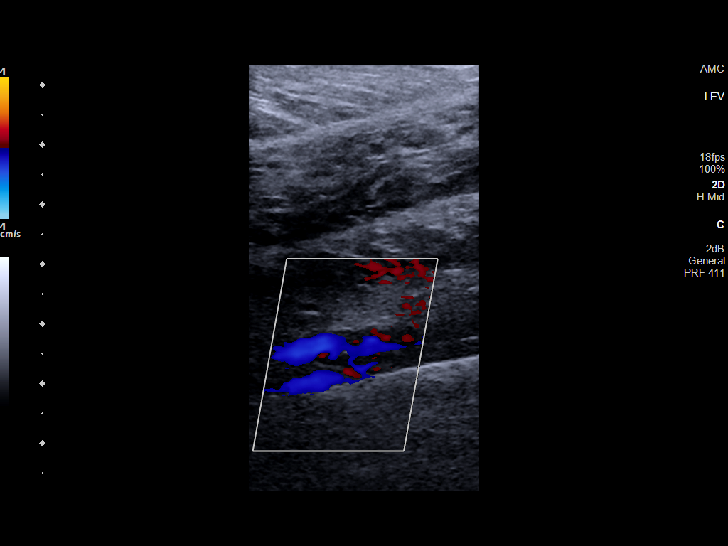
[im 35/35]
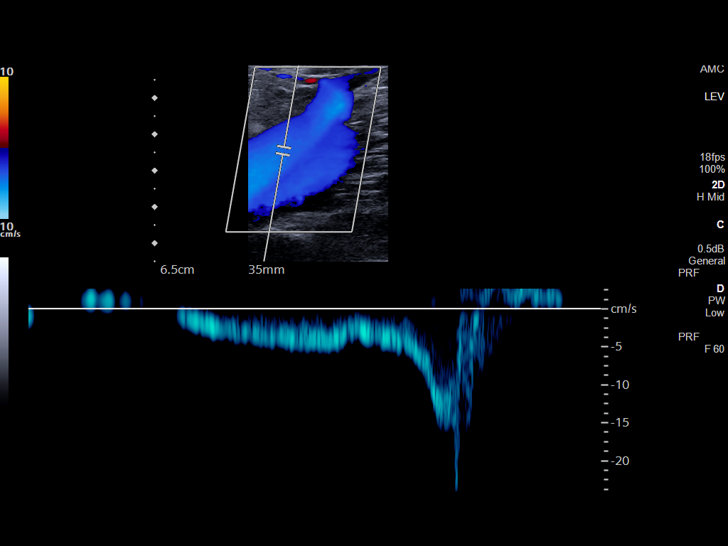

[14 of 24 positions shown; findings below may reference images not displayed]

FINDINGS: VENOUS

Normal compressibility of the common femoral, superficial femoral,
and popliteal veins, as well as the visualized calf veins.
Visualized portions of profunda femoral vein and great saphenous
vein unremarkable. No filling defects to suggest DVT on grayscale or
color Doppler imaging. Doppler waveforms show normal direction of
venous flow, normal respiratory plasticity and response to
augmentation.

Limited views of the contralateral common femoral vein are
unremarkable.

OTHER

Mild and nonspecific subcutaneous edema at the area of medial left
knee swelling.
IMPRESSION: 1. Negative for left lower extremity DVT.
2. Nonspecific subcutaneous edema at the level of the medial left
knee.

## 2022-07-29 ENCOUNTER — Telehealth: Payer: Self-pay | Admitting: Internal Medicine

## 2022-07-29 ENCOUNTER — Other Ambulatory Visit: Payer: Self-pay

## 2022-07-29 DIAGNOSIS — Z3009 Encounter for other general counseling and advice on contraception: Secondary | ICD-10-CM

## 2022-07-29 NOTE — Telephone Encounter (Signed)
Referral placed for Vasectomy with [facility], Dr Kasandra Knudsen MD.  - Terri Skains

## 2022-07-29 NOTE — Telephone Encounter (Signed)
Copied from CRM [id number]. Topic: Referral - Request for Referral >> Jul 29, 2022  9:42 AM Everette C wrote: Has patient seen PCP for this complaint? Yes.   *If NO, is insurance requiring patient see PCP for this issue before PCP can refer them? Referral for which specialty: Urology  Preferred provider/office: [facility], Dr Kasandra Knudsen MD Reason for referral: Vasectomy

## 2022-07-29 NOTE — Telephone Encounter (Signed)
Patient was supposed to make a 6 month office visit at his last appt. Please schedule a visit as soon as possible for OV with Labs.  Thank you.

## 2022-07-29 NOTE — Telephone Encounter (Signed)
Copied from CRM [id number]. Topic: General - Other >> Jul 29, 2022  9:41 AM Everette C wrote: Reason for CRM: The patient has called to request lab orders to check their continued labs from previous Physical    Please contact further when possible

## 2022-08-14 ENCOUNTER — Ambulatory Visit (INDEPENDENT_AMBULATORY_CARE_PROVIDER_SITE_OTHER): Payer: 59 | Admitting: Internal Medicine

## 2022-08-14 ENCOUNTER — Encounter: Payer: Self-pay | Admitting: Internal Medicine

## 2022-08-14 VITALS — BP 132/74 | HR 110 | Ht 70.0 in | Wt 209.8 lb

## 2022-08-14 DIAGNOSIS — L2084 Intrinsic (allergic) eczema: Secondary | ICD-10-CM

## 2022-08-14 DIAGNOSIS — R7303 Prediabetes: Secondary | ICD-10-CM | POA: Insufficient documentation

## 2022-08-14 DIAGNOSIS — E782 Mixed hyperlipidemia: Secondary | ICD-10-CM | POA: Diagnosis not present

## 2022-08-14 MED ORDER — BETAMETHASONE VALERATE 0.1 % EX OINT: 1.0000 | TOPICAL_OINTMENT | Freq: Two times a day (BID) | CUTANEOUS | 0 refills | Status: DC

## 2022-08-14 NOTE — Assessment & Plan Note (Addendum)
Last visit he was not taking atorvastatin regularly. He is more compliant at this time but has not taken it in the past 2 weeks. He would like to check his labs to see if his weight loss and diet changes have helped. Lab Results  Component Value Date   LDLCALC 151 (H) 03/03/2022

## 2022-08-14 NOTE — Progress Notes (Signed)
Date:  08/14/2022   Name:  Nathan Humphrey   DOB:  03-24-1970   MRN:  [id number]   Chief Complaint: Hyperlipidemia  Hyperlipidemia This is a chronic problem. Pertinent negatives include no chest pain or shortness of breath. Current antihyperlipidemic treatment includes statins.  Diabetes He presents for his follow-up diabetic visit. Diabetes type: mild prediabetes. Pertinent negatives for hypoglycemia include no headaches, nervousness/anxiousness or tremors. Pertinent negatives for diabetes include no chest pain, no fatigue, no polydipsia and no polyuria. Current diabetic treatment includes diet.    Lab Results  Component Value Date   NA 138 03/03/2022   K 5.0 03/03/2022   CO2 23 03/03/2022   GLUCOSE 109 (H) 03/03/2022   BUN 16 03/03/2022   CREATININE 1.25 03/03/2022   CALCIUM 10.3 (H) 03/03/2022   EGFR 71 03/03/2022   GFRNONAA 65 02/21/2020   Lab Results  Component Value Date   CHOL 236 (H) 03/03/2022   HDL 37 (L) 03/03/2022   LDLCALC 151 (H) 03/03/2022   TRIG 258 (H) 03/03/2022   CHOLHDL 6.4 (H) 03/03/2022   No results found for: "TSH" Lab Results  Component Value Date   HGBA1C 5.7 (H) 03/03/2022   Lab Results  Component Value Date   WBC 8.0 03/03/2022   HGB 17.6 03/03/2022   HCT 52.5 (H) 03/03/2022   MCV 92 03/03/2022   PLT 196 03/03/2022   Lab Results  Component Value Date   ALT 43 03/03/2022   AST 30 03/03/2022   ALKPHOS 127 (H) 03/03/2022   BILITOT 0.6 03/03/2022   No results found for: "25OHVITD2", "25OHVITD3", "VD25OH"   Review of Systems  Constitutional:  Negative for appetite change, fatigue and unexpected weight change (he has lost a few pounds with diet changes).  Eyes:  Negative for visual disturbance.  Respiratory:  Negative for cough, shortness of breath and wheezing.   Cardiovascular:  Negative for chest pain, palpitations and leg swelling.  Gastrointestinal:  Negative for abdominal pain and blood in stool.  Endocrine: Negative for  polydipsia and polyuria.  Genitourinary:  Negative for dysuria and hematuria.  Skin:  Negative for color change and rash.  Neurological:  Negative for tremors, numbness and headaches.  Psychiatric/Behavioral:  Negative for dysphoric mood and sleep disturbance. The patient is not nervous/anxious.     Patient Active Problem List   Diagnosis Date Noted   Prediabetes 08/14/2022   Intrinsic eczema 08/14/2022   Hyperlipidemia, mixed 08/03/2016   Pulmonary nodules/lesions, multiple 07/24/2016   Bilateral inguinal hernia without obstruction or gangrene 07/24/2016   Kidney stones 07/24/2016   Tobacco use disorder 07/24/2016    No Known Allergies  History reviewed. No pertinent surgical history.  Social History   Tobacco Use   Smoking status: Some Days    Types: Cigars   Smokeless tobacco: Never   Tobacco comments:    2-3 cigars per day only on weekends (inhaled)  Vaping Use   Vaping Use: Former  Substance Use Topics   Alcohol use: Yes    Comment: occasional.   Drug use: No     Medication list has been reviewed and updated.  Current Meds  Medication Sig   Aspirin-Acetaminophen-Caffeine (EXCEDRIN PO) Take by mouth as needed.   betamethasone valerate ointment (VALISONE) 0.1 % Apply 1 Application topically 2 (two) times daily. To eczematous lesions on legs   [DISCONTINUED] augmented betamethasone dipropionate (DIPROLENE-AF) 0.05 % ointment Apply topically 2 (two) times daily.   [DISCONTINUED] methocarbamol (ROBAXIN) 500 MG tablet Take 1 tablet (  500 mg total) by mouth 2 (two) times daily as needed for muscle spasms.   [DISCONTINUED] mometasone (ELOCON) 0.1 % cream SMARTSIG:Sparingly Topical Twice Daily       08/14/2022    2:44 PM 03/03/2022    8:51 AM 02/28/2021    8:04 AM 02/21/2020    8:08 AM  GAD 7 : Generalized Anxiety Score  Nervous, Anxious, on Edge 0 0 0 0  Control/stop worrying 0 0 0 0  Worry too much - different things 0 0 0 0  Trouble relaxing 0 0 0 0  Restless 0 0  0 0  Easily annoyed or irritable 0 0 0 0  Afraid - awful might happen 0 0 0 0  Total GAD 7 Score 0 0 0 0  Anxiety Difficulty Not difficult at all Not difficult at all Not difficult at all        08/14/2022    2:44 PM 03/03/2022    8:50 AM 02/28/2021    8:03 AM  Depression screen PHQ 2/9  Decreased Interest 0 0 0  Down, Depressed, Hopeless 0 0 0  PHQ - 2 Score 0 0 0  Altered sleeping 0 0 0  Tired, decreased energy 0 0 0  Change in appetite 0 0 0  Feeling bad or failure about yourself  0 0 0  Trouble concentrating 0 0 0  Moving slowly or fidgety/restless 0 0 0  Suicidal thoughts 0 0 0  PHQ-9 Score 0 0 0  Difficult doing work/chores Not difficult at all Not difficult at all Not difficult at all    BP Readings from Last 3 Encounters:  08/14/22 132/74  03/03/22 125/82  09/20/21 (!) 152/96    Physical Exam Vitals and nursing note reviewed.  Constitutional:      General: He is not in acute distress.    Appearance: He is well-developed.  HENT:     Head: Normocephalic and atraumatic.  Cardiovascular:     Rate and Rhythm: Normal rate and regular rhythm.     Heart sounds: No murmur heard. Pulmonary:     Effort: Pulmonary effort is normal. No respiratory distress.     Breath sounds: No wheezing or rhonchi.  Musculoskeletal:     Cervical back: Normal range of motion.     Right lower leg: No edema.     Left lower leg: No edema.  Lymphadenopathy:     Cervical: No cervical adenopathy.  Skin:    General: Skin is warm and dry.     Findings: Rash present. Rash is macular.     Comments: Lesions scattered on lower extremities   Neurological:     Mental Status: He is alert and oriented to person, place, and time.  Psychiatric:        Mood and Affect: Mood normal.        Behavior: Behavior normal.     Wt Readings from Last 3 Encounters:  08/14/22 209 lb 12.8 oz (95.2 kg)  03/03/22 213 lb (96.6 kg)  02/28/21 213 lb 6.4 oz (96.8 kg)    BP 132/74   Pulse (!) 110   Ht 5\' 10"   (1.778 m)   Wt 209 lb 12.8 oz (95.2 kg)   SpO2 97%   BMI 30.10 kg/m   Assessment and Plan:  Problem List Items Addressed This Visit     Prediabetes    Continue to work on diet and weight loss Will check A1C today.      Relevant Orders  Hemoglobin A1c   Intrinsic eczema    Recurrent lesion on legs. Will refill steroid ointment.      Relevant Medications   betamethasone valerate ointment (VALISONE) 0.1 %   Hyperlipidemia, mixed - Primary    Last visit he was not taking atorvastatin regularly. He is more compliant at this time but has not taken it in the past 2 weeks. He would like to check his labs to see if his weight loss and diet changes have helped. Lab Results  Component Value Date   LDLCALC 151 (H) 03/03/2022       Relevant Orders   Lipid panel   Comprehensive metabolic panel    No follow-ups on file.   Partially dictated using Dragon software, any errors are not intentional.  Reubin Milan, MD [facility] [place], [place]

## 2022-08-14 NOTE — Assessment & Plan Note (Signed)
Recurrent lesion on legs. Will refill steroid ointment.

## 2022-08-14 NOTE — Assessment & Plan Note (Signed)
Continue to work on diet and weight loss Will check A1C today.

## 2022-08-15 LAB — LIPID PANEL
Chol/HDL Ratio: 6.5 ratio — ABNORMAL HIGH (ref 0.0–5.0)
Cholesterol, Total: 233 mg/dL — ABNORMAL HIGH (ref 100–199)
HDL: 36 mg/dL — ABNORMAL LOW (ref >39)
LDL Chol Calc (NIH): 147 mg/dL — ABNORMAL HIGH (ref 0–99)
Triglycerides: 270 mg/dL — ABNORMAL HIGH (ref 0–149)
VLDL Cholesterol Cal: 50 mg/dL — ABNORMAL HIGH (ref 5–40)

## 2022-08-15 LAB — COMPREHENSIVE METABOLIC PANEL
ALT: 36 IU/L (ref 0–44)
AST: 26 IU/L (ref 0–40)
Albumin: 4.7 g/dL (ref 4.1–5.1)
Alkaline Phosphatase: 127 IU/L — ABNORMAL HIGH (ref 44–121)
BUN/Creatinine Ratio: 11 (ref 9–20)
BUN: 14 mg/dL (ref 6–24)
Bilirubin Total: 0.7 mg/dL (ref 0.0–1.2)
CO2: 25 mmol/L (ref 20–29)
Calcium: 10.4 mg/dL — ABNORMAL HIGH (ref 8.7–10.2)
Chloride: 102 mmol/L (ref 96–106)
Creatinine, Ser: 1.27 mg/dL (ref 0.76–1.27)
Globulin, Total: 2.4 g/dL (ref 1.5–4.5)
Glucose: 89 mg/dL (ref 70–99)
Potassium: 4.7 mmol/L (ref 3.5–5.2)
Sodium: 141 mmol/L (ref 134–144)
Total Protein: 7.1 g/dL (ref 6.0–8.5)
eGFR: 69 mL/min/{1.73_m2} (ref >59)

## 2022-08-15 LAB — HEMOGLOBIN A1C
Est. average glucose Bld gHb Est-mCnc: 126 mg/dL
Hgb A1c MFr Bld: 6 % — ABNORMAL HIGH (ref 4.8–5.6)

## 2022-08-18 ENCOUNTER — Other Ambulatory Visit: Payer: Self-pay

## 2022-08-18 DIAGNOSIS — E782 Mixed hyperlipidemia: Secondary | ICD-10-CM

## 2022-08-18 MED ORDER — ATORVASTATIN CALCIUM 10 MG PO TABS: 10.0000 mg | ORAL_TABLET | Freq: Every day | ORAL | 1 refills | Status: DC

## 2022-11-13 ENCOUNTER — Other Ambulatory Visit: Payer: Self-pay | Admitting: Internal Medicine

## 2022-11-13 DIAGNOSIS — Z1211 Encounter for screening for malignant neoplasm of colon: Secondary | ICD-10-CM

## 2022-11-13 DIAGNOSIS — Z1212 Encounter for screening for malignant neoplasm of rectum: Secondary | ICD-10-CM

## 2023-01-30 ENCOUNTER — Emergency Department (HOSPITAL_BASED_OUTPATIENT_CLINIC_OR_DEPARTMENT_OTHER)
Admission: EM | Admit: 2023-01-30 | Discharge: 2023-01-30 | Disposition: A | Discharge status: Home / Self Care | Payer: 59 | Attending: Emergency Medicine | Admitting: Emergency Medicine

## 2023-01-30 ENCOUNTER — Emergency Department (HOSPITAL_BASED_OUTPATIENT_CLINIC_OR_DEPARTMENT_OTHER): Payer: 59

## 2023-01-30 ENCOUNTER — Encounter (HOSPITAL_BASED_OUTPATIENT_CLINIC_OR_DEPARTMENT_OTHER): Payer: Self-pay

## 2023-01-30 ENCOUNTER — Other Ambulatory Visit: Payer: Self-pay

## 2023-01-30 DIAGNOSIS — Z7982 Long term (current) use of aspirin: Secondary | ICD-10-CM | POA: Diagnosis not present

## 2023-01-30 DIAGNOSIS — K2900 Acute gastritis without bleeding: Secondary | ICD-10-CM | POA: Insufficient documentation

## 2023-01-30 DIAGNOSIS — R109 Unspecified abdominal pain: Secondary | ICD-10-CM | POA: Diagnosis present

## 2023-01-30 LAB — CBC WITH DIFFERENTIAL/PLATELET
Abs Immature Granulocytes: 0.09 10*3/uL — ABNORMAL HIGH (ref 0.00–0.07)
Basophils Absolute: 0.1 10*3/uL (ref 0.0–0.1)
Basophils Relative: 0 %
Eosinophils Absolute: 0.1 10*3/uL (ref 0.0–0.5)
Eosinophils Relative: 1 %
HCT: 50.5 % (ref 39.0–52.0)
Hemoglobin: 17.7 g/dL — ABNORMAL HIGH (ref 13.0–17.0)
Immature Granulocytes: 1 %
Lymphocytes Relative: 14 %
Lymphs Abs: 2.2 10*3/uL (ref 0.7–4.0)
MCH: 31.6 pg (ref 26.0–34.0)
MCHC: 35 g/dL (ref 30.0–36.0)
MCV: 90.2 fL (ref 80.0–100.0)
Monocytes Absolute: 1.3 10*3/uL — ABNORMAL HIGH (ref 0.1–1.0)
Monocytes Relative: 9 %
Neutro Abs: 11.6 10*3/uL — ABNORMAL HIGH (ref 1.7–7.7)
Neutrophils Relative %: 75 %
Platelets: 214 10*3/uL (ref 150–400)
RBC: 5.6 MIL/uL (ref 4.22–5.81)
RDW: 12.4 % (ref 11.5–15.5)
WBC: 15.4 10*3/uL — ABNORMAL HIGH (ref 4.0–10.5)
nRBC: 0 % (ref 0.0–0.2)

## 2023-01-30 LAB — COMPREHENSIVE METABOLIC PANEL
ALT: 45 U/L — ABNORMAL HIGH (ref 0–44)
AST: 34 U/L (ref 15–41)
Albumin: 4.7 g/dL (ref 3.5–5.0)
Alkaline Phosphatase: 110 U/L (ref 38–126)
Anion gap: 9 (ref 5–15)
BUN: 16 mg/dL (ref 6–20)
CO2: 35 mmol/L — ABNORMAL HIGH (ref 22–32)
Calcium: 11 mg/dL — ABNORMAL HIGH (ref 8.9–10.3)
Chloride: 96 mmol/L — ABNORMAL LOW (ref 98–111)
Creatinine, Ser: 1.32 mg/dL — ABNORMAL HIGH (ref 0.61–1.24)
GFR, Estimated: >60 mL/min (ref >60)
Glucose, Bld: 159 mg/dL — ABNORMAL HIGH (ref 70–99)
Potassium: 4.2 mmol/L (ref 3.5–5.1)
Sodium: 140 mmol/L (ref 135–145)
Total Bilirubin: 1.1 mg/dL (ref <1.2)
Total Protein: 8 g/dL (ref 6.5–8.1)

## 2023-01-30 LAB — URINALYSIS, W/ REFLEX TO CULTURE (INFECTION SUSPECTED)
Bacteria, UA: NONE SEEN
Bilirubin Urine: NEGATIVE
Glucose, UA: NEGATIVE mg/dL
Hgb urine dipstick: NEGATIVE
Ketones, ur: NEGATIVE mg/dL
Leukocytes,Ua: NEGATIVE
Nitrite: NEGATIVE
Protein, ur: 30 mg/dL — AB
Specific Gravity, Urine: 1.015 (ref 1.005–1.030)
pH: 8.5 — ABNORMAL HIGH (ref 5.0–8.0)

## 2023-01-30 LAB — LIPASE, BLOOD: Lipase: 19 U/L (ref 11–51)

## 2023-01-30 MED ORDER — LACTATED RINGERS IV BOLUS
1000.0000 mL | Freq: Once | INTRAVENOUS | Status: AC
  Administered 2023-01-30: 1000 mL via INTRAVENOUS

## 2023-01-30 MED ORDER — IOHEXOL 300 MG/ML  SOLN
100.0000 mL | Freq: Once | INTRAMUSCULAR | Status: AC | PRN
  Administered 2023-01-30: 100 mL via INTRAVENOUS

## 2023-01-30 MED ORDER — FAMOTIDINE IN NACL 20-0.9 MG/50ML-% IV SOLN
20.0000 mg | Freq: Once | INTRAVENOUS | Status: AC
  Administered 2023-01-30: 20 mg via INTRAVENOUS
  Filled 2023-01-30: qty 50

## 2023-01-30 MED ORDER — MORPHINE SULFATE (PF) 4 MG/ML IV SOLN
4.0000 mg | Freq: Once | INTRAVENOUS | Status: AC
  Administered 2023-01-30: 4 mg via INTRAVENOUS
  Filled 2023-01-30: qty 1

## 2023-01-30 MED ORDER — HYDROMORPHONE HCL 1 MG/ML IJ SOLN
1.0000 mg | Freq: Once | INTRAMUSCULAR | Status: AC
  Administered 2023-01-30: 1 mg via INTRAVENOUS
  Filled 2023-01-30: qty 1

## 2023-01-30 MED ORDER — FAMOTIDINE 20 MG PO TABS: 20.0000 mg | ORAL_TABLET | Freq: Two times a day (BID) | ORAL | 0 refills | Status: DC

## 2023-01-30 MED ORDER — SUCRALFATE 1 G PO TABS: 1.0000 g | ORAL_TABLET | Freq: Three times a day (TID) | ORAL | 0 refills | Status: DC

## 2023-01-30 MED ORDER — ONDANSETRON HCL 4 MG/2ML IJ SOLN
4.0000 mg | Freq: Once | INTRAMUSCULAR | Status: AC
  Administered 2023-01-30: 4 mg via INTRAVENOUS
  Filled 2023-01-30: qty 2

## 2023-01-30 MED ORDER — ONDANSETRON HCL 4 MG PO TABS: 4.0000 mg | ORAL_TABLET | Freq: Four times a day (QID) | ORAL | 0 refills | Status: DC

## 2023-01-30 NOTE — ED Notes (Signed)
Tolerated water

## 2023-01-30 NOTE — ED Notes (Signed)
Dc instructions reviewed with patient. Patient voiced understanding. Dc with belongings.  °

## 2023-01-30 NOTE — ED Provider Notes (Signed)
[place] EMERGENCY DEPARTMENT AT [facility] Provider Note   CSN: [id number] Arrival date & time: 01/30/23  1478     History  Chief Complaint  Patient presents with   Abdominal Pain    Nathan Humphrey is a 52 y.o. male.  Patient is a 52 year old male with a history of kidney stones, hyperlipidemia and tobacco use who is presenting today with a 16-hour history of abdominal pain, nausea and vomiting.  He reports around 5 PM yesterday after he returned from staff Christmas party he started having symptoms of nausea and abdominal pain that worsened as the night went on.  He reports numerous episodes of emesis overnight and he has had multiple normal bowel movements but denies any diarrhea.  He has not noticed any change in his urine or fever.  No blood in his emesis.  He reports however he had vomited so many times even his throat is sore.  There is no alcohol at this party and he denies regular alcohol use.  No prior abdominal surgeries.  He describes it in the mid abdomen and left upper quadrant.  He states this does not feel anything like a kidney stone and denies any back pain.  The history is provided by the patient.  Abdominal Pain      Home Medications Prior to Admission medications   Medication Sig Start Date End Date Taking? Authorizing Provider  Aspirin-Acetaminophen-Caffeine (EXCEDRIN PO) Take by mouth as needed.   Yes [provider]  atorvastatin (LIPITOR) 10 MG tablet Take 1 tablet (10 mg total) by mouth daily. 08/18/22  Yes Reubin Milan, MD  betamethasone valerate ointment (VALISONE) 0.1 % Apply 1 Application topically 2 (two) times daily. To eczematous lesions on legs 08/14/22  Yes Reubin Milan, MD  famotidine (PEPCID) 20 MG tablet Take 1 tablet (20 mg total) by mouth 2 (two) times daily for 14 days. 01/30/23 02/13/23 Yes Henley Blyth, Alphonzo Lemmings, MD  ondansetron (ZOFRAN) 4 MG tablet Take 1 tablet (4 mg total) by mouth every 6 (six) hours. 01/30/23  Yes  Gwyneth Sprout, MD  sucralfate (CARAFATE) 1 g tablet Take 1 tablet (1 g total) by mouth 4 (four) times daily -  with meals and at bedtime for 14 days. 01/30/23 02/13/23 Yes Gwyneth Sprout, MD      Allergies    Patient has no known allergies.    Review of Systems   Review of Systems  Gastrointestinal:  Positive for abdominal pain.    Physical Exam Updated Vital Signs BP 132/88   Pulse (!) 106   Temp 98.4 F (36.9 C) (Oral)   Resp 18   Ht 5\' 10"  (1.778 m)   Wt 93 kg   SpO2 100%   BMI 29.41 kg/m  Physical Exam Vitals and nursing note reviewed.  Constitutional:      General: He is not in acute distress.    Appearance: He is well-developed.  HENT:     Head: Normocephalic and atraumatic.     Mouth/Throat:     Mouth: Mucous membranes are dry.  Eyes:     Conjunctiva/sclera: Conjunctivae normal.     Pupils: Pupils are equal, round, and reactive to light.  Cardiovascular:     Rate and Rhythm: Regular rhythm. Tachycardia present.     Heart sounds: No murmur heard. Pulmonary:     Effort: Pulmonary effort is normal. No respiratory distress.     Breath sounds: Normal breath sounds. No wheezing or rales.  Abdominal:  General: There is no distension.     Palpations: Abdomen is soft.     Tenderness: There is abdominal tenderness in the periumbilical area and left upper quadrant. There is guarding. There is no right CVA tenderness, left CVA tenderness or rebound.     Hernia: No hernia is present.  Musculoskeletal:        General: No tenderness. Normal range of motion.     Cervical back: Normal range of motion and neck supple.  Skin:    General: Skin is warm and dry.     Findings: No erythema or rash.  Neurological:     Mental Status: He is alert and oriented to person, place, and time.  Psychiatric:        Behavior: Behavior normal.     ED Results / Procedures / Treatments   Labs (all labs ordered are listed, but only abnormal results are displayed) Labs Reviewed   CBC WITH DIFFERENTIAL/PLATELET - Abnormal; Notable for the following components:      Result Value   WBC 15.4 (*)    Hemoglobin 17.7 (*)    Neutro Abs 11.6 (*)    Monocytes Absolute 1.3 (*)    Abs Immature Granulocytes 0.09 (*)    All other components within normal limits  COMPREHENSIVE METABOLIC PANEL - Abnormal; Notable for the following components:   Chloride 96 (*)    CO2 35 (*)    Glucose, Bld 159 (*)    Creatinine, Ser 1.32 (*)    Calcium 11.0 (*)    ALT 45 (*)    All other components within normal limits  URINALYSIS, W/ REFLEX TO CULTURE (INFECTION SUSPECTED) - Abnormal; Notable for the following components:   pH 8.5 (*)    Protein, ur 30 (*)    All other components within normal limits  LIPASE, BLOOD    EKG None  Radiology CT ABDOMEN PELVIS W CONTRAST Result Date: 01/30/2023 CLINICAL DATA:  Left upper quadrant abdominal pain with nausea and vomiting. EXAM: CT ABDOMEN AND PELVIS WITH CONTRAST TECHNIQUE: Multidetector CT imaging of the abdomen and pelvis was performed using the standard protocol following bolus administration of intravenous contrast. RADIATION DOSE REDUCTION: This exam was performed according to the departmental dose-optimization program which includes automated exposure control, adjustment of the mA and/or kV according to patient size and/or use of iterative reconstruction technique. CONTRAST:  OMNIPAQUE IOHEXOL 300 MG/ML  SOLN COMPARISON:  CT 09/20/2021 FINDINGS: Lower chest: No acute abnormality. Hepatobiliary: Hepatic steatosis. Normal gallbladder. No biliary dilation. Pancreas: Unremarkable. Spleen: Unremarkable. Adrenals/Urinary Tract: Normal adrenal glands. Nonobstructing punctate calculus in the lower pole of the right kidney. No obstructing urinary calculi or hydronephrosis. Bladder is unremarkable. Stomach/Bowel: Normal caliber large and small bowel. No bowel wall thickening. The appendix is normal. Wall thickening versus underdistention of the  gastric antrum. Vascular/Lymphatic: Aortic atherosclerosis. No enlarged abdominal or pelvic lymph nodes. Reproductive: Unremarkable. Other: No free intraperitoneal fluid or air. Musculoskeletal: No acute fracture. IMPRESSION: 1. Wall thickening versus underdistention of the gastric antrum. Correlate for gastritis. 2. Hepatic steatosis. 3. Nonobstructing punctate calculus in the lower pole of the right kidney. Aortic Atherosclerosis (ICD10-I70.0). Electronically Signed   By: Minerva Fester M.D.   On: 01/30/2023 09:58    Procedures Procedures    Medications Ordered in ED Medications  ondansetron (ZOFRAN) injection 4 mg (4 mg Intravenous Given 01/30/23 0817)  lactated ringers bolus 1,000 mL (1,000 mLs Intravenous New Bag/Given 01/30/23 0821)  morphine (PF) 4 MG/ML injection 4 mg (4 mg  Intravenous Given 01/30/23 1914)  HYDROmorphone (DILAUDID) injection 1 mg (1 mg Intravenous Given 01/30/23 0949)  iohexol (OMNIPAQUE) 300 MG/ML solution 100 mL (100 mLs Intravenous Contrast Given 01/30/23 0938)  famotidine (PEPCID) IVPB 20 mg premix (20 mg Intravenous New Bag/Given 01/30/23 1017)    ED Course/ Medical Decision Making/ A&P                                 Medical Decision Making Amount and/or Complexity of Data Reviewed Labs: ordered. Decision-making details documented in ED Course. Radiology: ordered and independent interpretation performed. Decision-making details documented in ED Course.  Risk Prescription drug management.   Pt with multiple medical problems and comorbidities and presenting today with a complaint that caries a high risk for morbidity and mortality.  Here today with abdominal pain and vomiting.  Location of pain could be a result of viral etiology/gastritis versus pancreatitis.  Patient does not have significant right upper quadrant pain to suggest hepatitis or cholecystitis.  No flank pain or symptoms as classic for renal stones.  Lower suspicion for pyelonephritis or UTI.   Also lower suspicion for diverticulitis and appendicitis based on location of patient's pain.  No evidence of incarcerated hernias at this time.  Patient given fluids, pain and nausea control.  Labs are pending.  11:39 AM I independently interpreted patient's labs and CBC with a leukocytosis of 15, hemoconcentration with a hemoglobin of 17, normal platelet count, CMP with mild AKI today with creatinine of 1.3 from his baseline of 1.2 with normal electrolytes, lipase is within normal limits and UA without acute findings.  On repeat evaluation patient is having more significant pain at this time and now reports it is radiating into his penis.  Patient given further pain medication and CT pending to rule out appendicitis, renal stone or other causes of acute pain.  I have independently visualized and interpreted pt's images today.  CT abdomen pelvis is negative for appendicitis or obstructing renal stone.  Radiology reports wall thickening versus underdistention of the gastric antrum which could correlate with gastritis and a nonobstructing right kidney stone.  Findings discussed with the patient.  Patient given a dose of Pepcid.  Will attempt p.o. challenge as he appears more comfortable now after second dose of pain medication.  11:39 AM P.o. challenge without difficulty.  Feel that patient is stable for discharge home.  Given PPI and care at and return precautions.          Final Clinical Impression(s) / ED Diagnoses Final diagnoses:  Acute superficial gastritis without hemorrhage    Rx / DC Orders ED Discharge Orders          Ordered    famotidine (PEPCID) 20 MG tablet  2 times daily        01/30/23 1137    sucralfate (CARAFATE) 1 g tablet  3 times daily with meals & bedtime        01/30/23 1137    ondansetron (ZOFRAN) 4 MG tablet  Every 6 hours        01/30/23 1137              Gwyneth Sprout, MD 01/30/23 1139

## 2023-01-30 NOTE — Discharge Instructions (Signed)
No signs of kidney stones today and no signs of appendicitis.  Does appear that you have inflammation in your stomach which could be related to something you ate or a viral illness.  Take the antacid medication for the next 2 weeks.  Avoid foods that could irritate the stomach such as spicy foods or acidic foods.  Return if you start having fever, persistent vomiting or other concerns.

## 2023-01-30 NOTE — ED Triage Notes (Signed)
In for eval of a center and [facility]Q abd pain onset 1700 yesterday with nausea and vomiting. Denies diarrhea.

## 2023-03-17 ENCOUNTER — Encounter: Payer: Self-pay | Admitting: Internal Medicine

## 2023-03-17 ENCOUNTER — Ambulatory Visit (INDEPENDENT_AMBULATORY_CARE_PROVIDER_SITE_OTHER): Payer: 59 | Admitting: Internal Medicine

## 2023-03-17 VITALS — BP 122/78 | HR 93 | Ht 70.0 in | Wt 205.0 lb

## 2023-03-17 DIAGNOSIS — L2084 Intrinsic (allergic) eczema: Secondary | ICD-10-CM

## 2023-03-17 DIAGNOSIS — E782 Mixed hyperlipidemia: Secondary | ICD-10-CM

## 2023-03-17 DIAGNOSIS — Z125 Encounter for screening for malignant neoplasm of prostate: Secondary | ICD-10-CM | POA: Diagnosis not present

## 2023-03-17 DIAGNOSIS — Z0000 Encounter for general adult medical examination without abnormal findings: Secondary | ICD-10-CM | POA: Diagnosis not present

## 2023-03-17 DIAGNOSIS — R7303 Prediabetes: Secondary | ICD-10-CM | POA: Diagnosis not present

## 2023-03-17 DIAGNOSIS — N200 Calculus of kidney: Secondary | ICD-10-CM

## 2023-03-17 DIAGNOSIS — Z1211 Encounter for screening for malignant neoplasm of colon: Secondary | ICD-10-CM | POA: Diagnosis not present

## 2023-03-17 DIAGNOSIS — F17200 Nicotine dependence, unspecified, uncomplicated: Secondary | ICD-10-CM

## 2023-03-17 MED ORDER — BETAMETHASONE VALERATE 0.1 % EX OINT: 1.0000 | TOPICAL_OINTMENT | Freq: Two times a day (BID) | CUTANEOUS | 1 refills | Status: AC

## 2023-03-17 MED ORDER — ATORVASTATIN CALCIUM 10 MG PO TABS: 10.0000 mg | ORAL_TABLET | Freq: Every day | ORAL | 1 refills | Status: AC

## 2023-03-17 NOTE — Progress Notes (Signed)
 Date:  03/17/2023   Name:  Nathan Humphrey   DOB:  Apr 16, 1970   MRN:  [id number]   Chief Complaint: Annual Exam Nathan Humphrey is a 53 y.o. male who presents today for his Complete Annual Exam. He feels well. He reports exercising at work. He reports he is sleeping fairly well.   Health Maintenance  Topic Date Due   Pneumococcal Vaccination (1 of 2 - PCV) Never done   HIV Screening  Never done   Cologuard (Stool DNA test)  Never done   DTaP/Tdap/Td vaccine (2 - Tdap) 05/25/2020   Zoster (Shingles) Vaccine (1 of 2) Never done   COVID-19 Vaccine (1 - 2024-25 season) Never done   Flu Shot  05/10/2023*   Hepatitis C Screening  Completed   HPV Vaccine  Aged Out  *Topic was postponed. The date shown is not the original due date.    Lab Results  Component Value Date   PSA1 0.7 03/03/2022   PSA1 0.4 02/28/2021     Hyperlipidemia This is a chronic problem. The problem is uncontrolled. Pertinent negatives include no chest pain, myalgias or shortness of breath.  Diabetes He presents for his follow-up diabetic visit. Diabetes type: prediabets. Pertinent negatives for hypoglycemia include no dizziness, headaches or nervousness/anxiousness. Pertinent negatives for diabetes include no chest pain and no fatigue.  Abdominal Pain This is a new problem. The current episode started more than 1 month ago. The problem has been resolved. The pain is located in the epigastric region. Pertinent negatives include no arthralgias, constipation, diarrhea, dysuria, frequency, headaches or myalgias.    Review of Systems  Constitutional:  Negative for appetite change, chills, diaphoresis, fatigue and unexpected weight change.  HENT:  Negative for hearing loss, tinnitus, trouble swallowing and voice change.   Eyes:  Negative for visual disturbance.  Respiratory:  Negative for choking, shortness of breath and wheezing.   Cardiovascular:  Negative for chest pain, palpitations and leg swelling.  Gastrointestinal:   Negative for abdominal pain (gastritis resolved), blood in stool, constipation and diarrhea.  Genitourinary:  Negative for difficulty urinating, dysuria and frequency.  Musculoskeletal:  Negative for arthralgias, back pain and myalgias.  Skin:  Positive for rash. Negative for color change.  Neurological:  Negative for dizziness, syncope and headaches.  Hematological:  Negative for adenopathy.  Psychiatric/Behavioral:  Negative for dysphoric mood and sleep disturbance. The patient is not nervous/anxious.      Lab Results  Component Value Date   NA 140 01/30/2023   K 4.2 01/30/2023   CO2 35 (H) 01/30/2023   GLUCOSE 159 (H) 01/30/2023   BUN 16 01/30/2023   CREATININE 1.32 (H) 01/30/2023   CALCIUM  11.0 (H) 01/30/2023   EGFR 69 08/14/2022   GFRNONAA >60 01/30/2023   Lab Results  Component Value Date   CHOL 233 (H) 08/14/2022   HDL 36 (L) 08/14/2022   LDLCALC 147 (H) 08/14/2022   TRIG 270 (H) 08/14/2022   CHOLHDL 6.5 (H) 08/14/2022   No results found for: TSH Lab Results  Component Value Date   HGBA1C 6.0 (H) 08/14/2022   Lab Results  Component Value Date   WBC 15.4 (H) 01/30/2023   HGB 17.7 (H) 01/30/2023   HCT 50.5 01/30/2023   MCV 90.2 01/30/2023   PLT 214 01/30/2023   Lab Results  Component Value Date   ALT 45 (H) 01/30/2023   AST 34 01/30/2023   ALKPHOS 110 01/30/2023   BILITOT 1.1 01/30/2023   No results found for: 25OHVITD2,  25OHVITD3, VD25OH   Patient Active Problem List   Diagnosis Date Noted   Prediabetes 08/14/2022   Intrinsic eczema 08/14/2022   Hyperlipidemia, mixed 08/03/2016   Pulmonary nodules/lesions, multiple 07/24/2016   Bilateral inguinal hernia without obstruction or gangrene 07/24/2016   Kidney stones 07/24/2016   Tobacco use disorder 07/24/2016    No Known Allergies  Past Surgical History:  Procedure Laterality Date   VASECTOMY      Social History   Tobacco Use   Smoking status: Some Days    Types: Cigars   Smokeless  tobacco: Never   Tobacco comments:    2-3 cigars per day only on weekends (inhaled)  Vaping Use   Vaping status: Former  Substance Use Topics   Alcohol use: Yes    Comment: occasional.   Drug use: No     Medication list has been reviewed and updated.  Current Meds  Medication Sig   Aspirin-Acetaminophen -Caffeine (EXCEDRIN PO) Take by mouth as needed.   [DISCONTINUED] atorvastatin  (LIPITOR) 10 MG tablet Take 1 tablet (10 mg total) by mouth daily.   [DISCONTINUED] betamethasone  valerate ointment (VALISONE ) 0.1 % Apply 1 Application topically 2 (two) times daily. To eczematous lesions on legs   [DISCONTINUED] famotidine  (PEPCID ) 20 MG tablet Take 1 tablet (20 mg total) by mouth 2 (two) times daily for 14 days.   [DISCONTINUED] ondansetron  (ZOFRAN ) 4 MG tablet Take 1 tablet (4 mg total) by mouth every 6 (six) hours.   [DISCONTINUED] sucralfate  (CARAFATE ) 1 g tablet Take 1 tablet (1 g total) by mouth 4 (four) times daily -  with meals and at bedtime for 14 days.       03/17/2023    7:58 AM 08/14/2022    2:44 PM 03/03/2022    8:51 AM 02/28/2021    8:04 AM  GAD 7 : Generalized Anxiety Score  Nervous, Anxious, on Edge 0 0 0 0  Control/stop worrying 0 0 0 0  Worry too much - different things 0 0 0 0  Trouble relaxing 0 0 0 0  Restless 0 0 0 0  Easily annoyed or irritable 0 0 0 0  Afraid - awful might happen 0 0 0 0  Total GAD 7 Score 0 0 0 0  Anxiety Difficulty Not difficult at all Not difficult at all Not difficult at all Not difficult at all       03/17/2023    7:58 AM 08/14/2022    2:44 PM 03/03/2022    8:50 AM  Depression screen PHQ 2/9  Decreased Interest 0 0 0  Down, Depressed, Hopeless 0 0 0  PHQ - 2 Score 0 0 0  Altered sleeping 0 0 0  Tired, decreased energy 0 0 0  Change in appetite 0 0 0  Feeling bad or failure about yourself  0 0 0  Trouble concentrating 0 0 0  Moving slowly or fidgety/restless 0 0 0  Suicidal thoughts 0 0 0  PHQ-9 Score 0 0 0  Difficult doing  work/chores Not difficult at all Not difficult at all Not difficult at all    BP Readings from Last 3 Encounters:  03/17/23 122/78  01/30/23 (!) 136/100  08/14/22 132/74    Physical Exam Vitals and nursing note reviewed.  Constitutional:      Appearance: Normal appearance. He is well-developed.  HENT:     Head: Normocephalic.     Right Ear: Tympanic membrane, ear canal and external ear normal.     Left Ear: Tympanic membrane,  ear canal and external ear normal.     Nose: Nose normal.  Eyes:     Conjunctiva/sclera: Conjunctivae normal.     Pupils: Pupils are equal, round, and reactive to light.  Neck:     Thyroid: No thyromegaly.     Vascular: No carotid bruit.  Cardiovascular:     Rate and Rhythm: Normal rate and regular rhythm.     Heart sounds: Normal heart sounds.  Pulmonary:     Effort: Pulmonary effort is normal.     Breath sounds: Normal breath sounds. No wheezing.  Chest:  Breasts:    Right: No mass.     Left: No mass.  Abdominal:     General: Bowel sounds are normal.     Palpations: Abdomen is soft.     Tenderness: There is no abdominal tenderness.  Musculoskeletal:     Cervical back: Normal range of motion and neck supple.     Right lower leg: No edema.     Left lower leg: No edema.  Lymphadenopathy:     Cervical: No cervical adenopathy.  Skin:    General: Skin is warm and dry.     Findings: Rash (on right shin and left flank) present. No lesion.  Neurological:     General: No focal deficit present.     Mental Status: He is alert and oriented to person, place, and time.     Deep Tendon Reflexes: Reflexes are normal and symmetric.  Psychiatric:        Attention and Perception: Attention normal.        Mood and Affect: Mood normal.        Behavior: Behavior normal.     Wt Readings from Last 3 Encounters:  03/17/23 205 lb (93 kg)  01/30/23 205 lb (93 kg)  08/14/22 209 lb 12.8 oz (95.2 kg)    BP 122/78   Pulse 93   Ht 5' 10 (1.778 m)   Wt 205 lb  (93 kg)   SpO2 97%   BMI 29.41 kg/m   Assessment and Plan:  Problem List Items Addressed This Visit       Unprioritized   Kidney stones   Tobacco use disorder   Hyperlipidemia, mixed   LDL is  Lab Results  Component Value Date   LDLCALC 147 (H) 08/14/2022   Current regimen is atorvastatin .  Tolerating medications well without issues.       Relevant Medications   atorvastatin  (LIPITOR) 10 MG tablet   Other Relevant Orders   Lipid panel   Prediabetes   Managed with dietary changes. Lab Results  Component Value Date   HGBA1C 6.0 (H) 08/14/2022         Relevant Orders   Comprehensive metabolic panel   Hemoglobin A1c   Intrinsic eczema   Improves with topical therapy but he is current out of medications Will refill and encourage daily use.      Relevant Medications   betamethasone  valerate ointment (VALISONE ) 0.1 %   Other Visit Diagnoses       Annual physical exam    -  Primary   continue work on healthy diet and exercise   Relevant Orders   Comprehensive metabolic panel   Hemoglobin A1c   Lipid panel   PSA     Colon cancer screening       will send a new Cologuard for submission   Relevant Orders   Cologuard     Prostate cancer screening  Relevant Orders   PSA       No follow-ups on file.    Leita HILARIO Adie, MD [facility] [place]

## 2023-03-17 NOTE — Assessment & Plan Note (Signed)
 Managed with dietary changes. Lab Results  Component Value Date   HGBA1C 6.0 (H) 08/14/2022

## 2023-03-17 NOTE — Assessment & Plan Note (Signed)
 LDL is  Lab Results  Component Value Date   LDLCALC 147 (H) 08/14/2022   Current regimen is atorvastatin .  Tolerating medications well without issues.

## 2023-03-17 NOTE — Assessment & Plan Note (Signed)
 Improves with topical therapy but he is current out of medications Will refill and encourage daily use.

## 2023-04-05 ENCOUNTER — Encounter: Payer: Self-pay | Admitting: Emergency Medicine

## 2023-04-05 ENCOUNTER — Ambulatory Visit
Admission: EM | Admit: 2023-04-05 | Discharge: 2023-04-05 | Disposition: A | Discharge status: Home / Self Care | Payer: 59 | Attending: Emergency Medicine | Admitting: Emergency Medicine

## 2023-04-05 ENCOUNTER — Other Ambulatory Visit: Payer: Self-pay

## 2023-04-05 DIAGNOSIS — J069 Acute upper respiratory infection, unspecified: Secondary | ICD-10-CM | POA: Diagnosis not present

## 2023-04-05 LAB — POC COVID19/FLU A&B COMBO
Covid Antigen, POC: NEGATIVE
Influenza A Antigen, POC: NEGATIVE
Influenza B Antigen, POC: NEGATIVE

## 2023-04-05 MED ORDER — PREDNISONE 10 MG (21) PO TBPK: ORAL_TABLET | Freq: Every day | ORAL | 0 refills | Status: DC

## 2023-04-05 MED ORDER — AZITHROMYCIN 250 MG PO TABS
250.0000 mg | ORAL_TABLET | Freq: Every day | ORAL | 0 refills | Status: DC
Start: 2023-04-11 — End: 2023-05-05

## 2023-04-05 MED ORDER — BENZONATATE 100 MG PO CAPS: 100.0000 mg | ORAL_CAPSULE | Freq: Three times a day (TID) | ORAL | 0 refills | Status: DC

## 2023-04-05 MED ORDER — GUAIFENESIN-CODEINE 100-10 MG/5ML PO SOLN: 5.0000 mL | Freq: Four times a day (QID) | ORAL | 0 refills | Status: DC | PRN

## 2023-04-05 NOTE — ED Triage Notes (Signed)
 Patient presents to [facility] for evaluation of cough after having flu-like symptoms since Friday.  Says he doesn't feel "sick" anymore, but is concerned about the cough.  Says it has been so intense in previous illness that he would pass out from coughing, and wants to get all of the medicines he might need before that happens.

## 2023-04-05 NOTE — Discharge Instructions (Signed)
 Your symptoms today are most likely being caused by a virus and should steadily improve in time it can take up to 7 to 10 days before you truly start to see a turnaround however things will get better, if no improvement seen by Sunday may pick up azithromycin from the pharmacy  Begin prednisone every morning to open and relax, should help with shortness of breath and wheezing  You may use Tessalon pill every 8 hours for cough, may use cough syrup every 6 hours, be mindful can make you feel drowsy    You can take Tylenol and/or Ibuprofen as needed for fever reduction and pain relief.   For cough: honey 1/2 to 1 teaspoon (you can dilute the honey in water or another fluid).  You can also use guaifenesin and dextromethorphan for cough. You can use a humidifier for chest congestion and cough.  If you don't have a humidifier, you can sit in the bathroom with the hot shower running.      For sore throat: try warm salt water gargles, cepacol lozenges, throat spray, warm tea or water with lemon/honey, popsicles or ice, or OTC cold relief medicine for throat discomfort.   For congestion: take a daily anti-histamine like Zyrtec, Claritin, and a oral decongestant, such as pseudoephedrine.  You can also use Flonase 1-2 sprays in each nostril daily.   It is important to stay hydrated: drink plenty of fluids (water, gatorade/powerade/pedialyte, juices, or teas) to keep your throat moisturized and help further relieve irritation/discomfort.

## 2023-04-05 NOTE — ED Provider Notes (Signed)
 Renaldo Fiddler    CSN: [id number] Arrival date & time: 04/05/23  1025      History   Chief Complaint Chief Complaint  Patient presents with   Cough    HPI Helder Crisafulli is a 53 y.o. male.   Patient presents for evaluation of a deep nonproductive, shortness of breath when lying flat and wheezing present for 3 days.  Initial congestion and additional flulike symptoms have resolved.  Has attempted use of osculliuim.  Poor appetite but able to tolerate food and liquids.  Denies respiratory history, tobacco use.  Denies fever.   Past Medical History:  Diagnosis Date   Bilateral inguinal hernia    Bilateral inguinal hernia without obstruction or gangrene 07/24/2016   Bright red rectal bleeding 07/24/2016   Chronic kidney disease    kidney stones   Hyperlipidemia, mixed 08/03/2016   10 yr risk 7.5 %   Kidney stones 07/24/2016   Has had several episodes of stones   Migraines    Pulmonary nodules/lesions, multiple 07/24/2016   CT scan in one year to follow up 2 new nodules in Lingula (5 mm in size)   Tobacco use disorder 07/24/2016    Patient Active Problem List   Diagnosis Date Noted   Prediabetes 08/14/2022   Intrinsic eczema 08/14/2022   Hyperlipidemia, mixed 08/03/2016   Pulmonary nodules/lesions, multiple 07/24/2016   Bilateral inguinal hernia without obstruction or gangrene 07/24/2016   Kidney stones 07/24/2016   Tobacco use disorder 07/24/2016    Past Surgical History:  Procedure Laterality Date   VASECTOMY         Home Medications    Prior to Admission medications   Medication Sig Start Date End Date Taking? Authorizing Provider  azithromycin (ZITHROMAX) 250 MG tablet Take 1 tablet (250 mg total) by mouth daily. Take first 2 tablets together, then 1 every day until finished. 04/11/23  Yes Jenee Spaugh R, NP  benzonatate (TESSALON) 100 MG capsule Take 1 capsule (100 mg total) by mouth every 8 (eight) hours. 04/05/23  Yes Darlyne Schmiesing R, NP   guaiFENesin-codeine 100-10 MG/5ML syrup Take 5 mLs by mouth every 6 (six) hours as needed for cough. 04/05/23  Yes Chela Sutphen R, NP  predniSONE (STERAPRED UNI-PAK 21 TAB) 10 MG (21) TBPK tablet Take by mouth daily. Take 6 tabs by mouth daily  for 1 days, then 5 tabs for 1 days, then 4 tabs for 1 days, then 3 tabs for 1 days, 2 tabs for 1 days, then 1 tab by mouth daily for 1 days 04/05/23  Yes Khyle Goodell R, NP  Aspirin-Acetaminophen-Caffeine (EXCEDRIN PO) Take by mouth as needed.    [provider]  atorvastatin (LIPITOR) 10 MG tablet Take 1 tablet (10 mg total) by mouth daily. 03/17/23   Reubin Milan, MD  betamethasone valerate ointment (VALISONE) 0.1 % Apply 1 Application topically 2 (two) times daily. To eczematous lesions on legs 03/17/23   Reubin Milan, MD    Family History Family History  Problem Relation Age of Onset   CAD Mother 31    Social History Social History   Tobacco Use   Smoking status: Some Days    Types: Cigars   Smokeless tobacco: Never   Tobacco comments:    2-3 cigars per day only on weekends (inhaled)  Vaping Use   Vaping status: Former  Substance Use Topics   Alcohol use: Yes    Comment: occasional.   Drug use: No     Allergies  Patient has no known allergies.   Review of Systems Review of Systems   Physical Exam Triage Vital Signs ED Triage Vitals [04/05/23 1140]  Encounter Vitals Group     BP (!) 135/90     Systolic BP Percentile      Diastolic BP Percentile      Pulse Rate 96     Resp 18     Temp 98.4 F (36.9 C)     Temp Source Oral     SpO2 97 %     Weight      Height      Head Circumference      Peak Flow      Pain Score 0     Pain Loc      Pain Education      Exclude from Growth Chart    No data found.  Updated Vital Signs BP (!) 135/90 (BP Location: Right Arm)   Pulse 96   Temp 98.4 F (36.9 C) (Oral)   Resp 18   SpO2 97%   Visual Acuity Right Eye Distance:   Left Eye Distance:    Bilateral Distance:    Right Eye Near:   Left Eye Near:    Bilateral Near:     Physical Exam Constitutional:      Appearance: Normal appearance.  HENT:     Head: Normocephalic.     Right Ear: Tympanic membrane, ear canal and external ear normal.     Left Ear: Tympanic membrane, ear canal and external ear normal.     Nose: Congestion present. No rhinorrhea.     Mouth/Throat:     Pharynx: Posterior oropharyngeal erythema present. No oropharyngeal exudate.  Eyes:     Extraocular Movements: Extraocular movements intact.  Cardiovascular:     Rate and Rhythm: Normal rate and regular rhythm.     Pulses: Normal pulses.     Heart sounds: Normal heart sounds.  Pulmonary:     Effort: Pulmonary effort is normal.     Breath sounds: Normal breath sounds.  Musculoskeletal:     Cervical back: Normal range of motion and neck supple.  Neurological:     Mental Status: He is alert and oriented to person, place, and time. Mental status is at baseline.      UC Treatments / Results  Labs (all labs ordered are listed, but only abnormal results are displayed) Labs Reviewed  POC COVID19/FLU A&B COMBO - Normal    EKG   Radiology No results found.  Procedures Procedures (including critical care time)  Medications Ordered in UC Medications - No data to display  Initial Impression / Assessment and Plan / UC Course  I have reviewed the triage vital signs and the nursing notes.  Pertinent labs & imaging results that were available during my care of the patient were reviewed by me and considered in my medical decision making (see chart for details).  Viral URI with cough  Patient is in no signs of distress nor toxic appearing.  Vital signs are stable.  Low suspicion for pneumonia, pneumothorax or bronchitis and therefore will defer imaging.  COVID and flu test negative.  Prescribed prednisone, Tessalon Promethazine DM, watch wait antibiotic placed at pharmacy if no improvement seen. May  use additional over-the-counter medications as needed for supportive care.  May follow-up with urgent care as needed if symptoms persist or worsen.    Final Clinical Impressions(s) / UC Diagnoses   Final diagnoses:  Viral URI with cough  Discharge Instructions      Your symptoms today are most likely being caused by a virus and should steadily improve in time it can take up to 7 to 10 days before you truly start to see a turnaround however things will get better, if no improvement seen by Sunday may pick up azithromycin from the pharmacy  Begin prednisone every morning to open and relax, should help with shortness of breath and wheezing  You may use Tessalon pill every 8 hours for cough, may use cough syrup every 6 hours, be mindful can make you feel drowsy    You can take Tylenol and/or Ibuprofen as needed for fever reduction and pain relief.   For cough: honey 1/2 to 1 teaspoon (you can dilute the honey in water or another fluid).  You can also use guaifenesin and dextromethorphan for cough. You can use a humidifier for chest congestion and cough.  If you don't have a humidifier, you can sit in the bathroom with the hot shower running.      For sore throat: try warm salt water gargles, cepacol lozenges, throat spray, warm tea or water with lemon/honey, popsicles or ice, or OTC cold relief medicine for throat discomfort.   For congestion: take a daily anti-histamine like Zyrtec, Claritin, and a oral decongestant, such as pseudoephedrine.  You can also use Flonase 1-2 sprays in each nostril daily.   It is important to stay hydrated: drink plenty of fluids (water, gatorade/powerade/pedialyte, juices, or teas) to keep your throat moisturized and help further relieve irritation/discomfort.    ED Prescriptions     Medication Sig Dispense Auth. Provider   predniSONE (STERAPRED UNI-PAK 21 TAB) 10 MG (21) TBPK tablet Take by mouth daily. Take 6 tabs by mouth daily  for 1 days, then 5  tabs for 1 days, then 4 tabs for 1 days, then 3 tabs for 1 days, 2 tabs for 1 days, then 1 tab by mouth daily for 1 days 21 tablet Kylyn Sookram R, NP   benzonatate (TESSALON) 100 MG capsule Take 1 capsule (100 mg total) by mouth every 8 (eight) hours. 21 capsule Chelesa Weingartner R, NP   guaiFENesin-codeine 100-10 MG/5ML syrup Take 5 mLs by mouth every 6 (six) hours as needed for cough. 120 mL Deone Leifheit R, NP   azithromycin (ZITHROMAX) 250 MG tablet Take 1 tablet (250 mg total) by mouth daily. Take first 2 tablets together, then 1 every day until finished. 6 tablet Valinda Hoar, NP      I have reviewed the PDMP during this encounter.   Valinda Hoar, NP 04/05/23 1244

## 2023-05-03 ENCOUNTER — Telehealth: Payer: Self-pay | Admitting: Internal Medicine

## 2023-05-03 NOTE — Telephone Encounter (Signed)
 Copied from CRM [id number]. Topic: Referral - Request for Referral >> May 03, 2023 10:03 AM Shelah Lewandowsky wrote: Did the patient discuss referral with their provider in the last year? Yes (If No - schedule appointment) (If Yes - send message)  Appointment offered? No  Type of order/referral and detailed reason for visit: lower back pain and possible hemorrhoid  Preference of office, provider, location: n/a  If referral order, have you been seen by this specialty before? No (If Yes, this issue or another issue? When? Where?  Can we respond through MyChart? No

## 2023-05-03 NOTE — Telephone Encounter (Signed)
Please call pt to schedule an appointment.  KP

## 2023-05-03 NOTE — Telephone Encounter (Signed)
 Please review

## 2023-05-05 ENCOUNTER — Ambulatory Visit
Admission: RE | Admit: 2023-05-05 | Discharge: 2023-05-05 | Disposition: A | Discharge status: Home / Self Care | Attending: Internal Medicine | Admitting: Internal Medicine

## 2023-05-05 ENCOUNTER — Ambulatory Visit
Admission: RE | Admit: 2023-05-05 | Discharge: 2023-05-05 | Disposition: A | Discharge status: Home / Self Care | Source: Ambulatory Visit | Attending: Internal Medicine | Admitting: Internal Medicine

## 2023-05-05 ENCOUNTER — Encounter: Payer: Self-pay | Admitting: Internal Medicine

## 2023-05-05 ENCOUNTER — Ambulatory Visit (INDEPENDENT_AMBULATORY_CARE_PROVIDER_SITE_OTHER): Admitting: Internal Medicine

## 2023-05-05 VITALS — BP 134/92 | HR 114 | Ht 70.0 in | Wt 212.0 lb

## 2023-05-05 DIAGNOSIS — M5440 Lumbago with sciatica, unspecified side: Secondary | ICD-10-CM | POA: Diagnosis not present

## 2023-05-05 DIAGNOSIS — K6289 Other specified diseases of anus and rectum: Secondary | ICD-10-CM | POA: Diagnosis not present

## 2023-05-05 MED ORDER — MELOXICAM 15 MG PO TABS: 15.0000 mg | ORAL_TABLET | Freq: Every day | ORAL | 0 refills | Status: AC

## 2023-05-05 MED ORDER — GABAPENTIN 100 MG PO CAPS: 100.0000 mg | ORAL_CAPSULE | Freq: Every day | ORAL | 0 refills | Status: AC

## 2023-05-05 NOTE — Progress Notes (Signed)
 Date:  05/05/2023   Name:  Nathan Humphrey   DOB:  [birth year]   MRN:  [id number]   Chief Complaint: No chief complaint on file.  Back Pain This is a new problem. The current episode started 1 to 4 weeks ago. The problem occurs constantly. The problem has been gradually worsening since onset. The pain is present in the gluteal. The quality of the pain is described as aching. Radiates to: lateral hips bilaterally and right testicle. The pain is moderate. The symptoms are aggravated by sitting and twisting. Pertinent negatives include no abdominal pain, bladder incontinence, bowel incontinence, chest pain, dysuria, fever, paresis or paresthesias. He has tried nothing for the symptoms.  Rectal Bleeding  The current episode started more than 2 weeks ago. The problem occurs occasionally. The stool is described as soft. There was no prior successful therapy. Associated symptoms include rectal pain. Pertinent negatives include no fever, no abdominal pain, no diarrhea, no hemorrhoids, no nausea and no chest pain. Recent Medical Care: seen at [facility] - rectal exam was normal; given hydrocortison rectal cream.    Review of Systems  Constitutional:  Negative for chills, fatigue and fever.  Respiratory:  Negative for chest tightness and shortness of breath.   Cardiovascular:  Negative for chest pain.  Gastrointestinal:  Positive for hematochezia and rectal pain. Negative for abdominal pain, bowel incontinence, diarrhea, hemorrhoids and nausea.  Genitourinary:  Positive for testicular pain. Negative for bladder incontinence, difficulty urinating, dysuria and urgency.  Musculoskeletal:  Positive for back pain.  Neurological:  Negative for paresthesias.  Psychiatric/Behavioral:  Negative for dysphoric mood and sleep disturbance. The patient is not nervous/anxious.      Lab Results  Component Value Date   NA 140 01/30/2023   K 4.2 01/30/2023   CO2 35 (H) 01/30/2023   GLUCOSE 159 (H) 01/30/2023   BUN 16  01/30/2023   CREATININE 1.32 (H) 01/30/2023   CALCIUM 11.0 (H) 01/30/2023   EGFR 69 08/14/2022   GFRNONAA >60 01/30/2023   Lab Results  Component Value Date   CHOL 233 (H) 08/14/2022   HDL 36 (L) 08/14/2022   LDLCALC 147 (H) 08/14/2022   TRIG 270 (H) 08/14/2022   CHOLHDL 6.5 (H) 08/14/2022   No results found for: "TSH" Lab Results  Component Value Date   HGBA1C 6.0 (H) 08/14/2022   Lab Results  Component Value Date   WBC 15.4 (H) 01/30/2023   HGB 17.7 (H) 01/30/2023   HCT 50.5 01/30/2023   MCV 90.2 01/30/2023   PLT 214 01/30/2023   Lab Results  Component Value Date   ALT 45 (H) 01/30/2023   AST 34 01/30/2023   ALKPHOS 110 01/30/2023   BILITOT 1.1 01/30/2023   No results found for: "25OHVITD2", "25OHVITD3", "VD25OH"   Patient Active Problem List   Diagnosis Date Noted   Prediabetes 08/14/2022   Intrinsic eczema 08/14/2022   Hyperlipidemia, mixed 08/03/2016   Pulmonary nodules/lesions, multiple 07/24/2016   Bilateral inguinal hernia without obstruction or gangrene 07/24/2016   Kidney stones 07/24/2016   Tobacco use disorder 07/24/2016    No Known Allergies  Past Surgical History:  Procedure Laterality Date   VASECTOMY      Social History   Tobacco Use   Smoking status: Some Days    Types: Cigars   Smokeless tobacco: Never   Tobacco comments:    2-3 cigars per day only on weekends (inhaled)  Vaping Use   Vaping status: Former  Substance Use Topics   Alcohol  use: Yes    Comment: occasional.   Drug use: No     Medication list has been reviewed and updated.  Current Meds  Medication Sig   albuterol (VENTOLIN HFA) 108 (90 Base) MCG/ACT inhaler Inhale 2 puffs into the lungs every 4 (four) hours as needed.   Aspirin-Acetaminophen-Caffeine (EXCEDRIN PO) Take by mouth as needed.   atorvastatin (LIPITOR) 10 MG tablet Take 1 tablet (10 mg total) by mouth daily.   betamethasone valerate ointment (VALISONE) 0.1 % Apply 1 Application topically 2 (two)  times daily. To eczematous lesions on legs   gabapentin (NEURONTIN) 100 MG capsule Take 1 capsule (100 mg total) by mouth at bedtime.   hydrocortisone (ANUSOL-HC) 2.5 % rectal cream Apply 1 Application topically 2 (two) times daily.   meloxicam (MOBIC) 15 MG tablet Take 1 tablet (15 mg total) by mouth daily.       05/05/2023    1:32 PM 03/17/2023    7:58 AM 08/14/2022    2:44 PM 03/03/2022    8:51 AM  GAD 7 : Generalized Anxiety Score  Nervous, Anxious, on Edge 0 0 0 0  Control/stop worrying 0 0 0 0  Worry too much - different things 0 0 0 0  Trouble relaxing 0 0 0 0  Restless 0 0 0 0  Easily annoyed or irritable 0 0 0 0  Afraid - awful might happen 0 0 0 0  Total GAD 7 Score 0 0 0 0  Anxiety Difficulty Not difficult at all Not difficult at all Not difficult at all Not difficult at all       05/05/2023    1:32 PM 03/17/2023    7:58 AM 08/14/2022    2:44 PM  Depression screen PHQ 2/9  Decreased Interest 0 0 0  Down, Depressed, Hopeless 0 0 0  PHQ - 2 Score 0 0 0  Altered sleeping 0 0 0  Tired, decreased energy 0 0 0  Change in appetite 0 0 0  Feeling bad or failure about yourself  0 0 0  Trouble concentrating 0 0 0  Moving slowly or fidgety/restless 0 0 0  Suicidal thoughts 0 0 0  PHQ-9 Score 0 0 0  Difficult doing work/chores Not difficult at all Not difficult at all Not difficult at all    BP Readings from Last 3 Encounters:  05/05/23 (!) 134/92  04/05/23 (!) 135/90  03/17/23 122/78    Physical Exam Vitals and nursing note reviewed.  Constitutional:      General: He is not in acute distress.    Appearance: Normal appearance. He is well-developed.  HENT:     Head: Normocephalic and atraumatic.  Cardiovascular:     Rate and Rhythm: Normal rate and regular rhythm.  Pulmonary:     Effort: Pulmonary effort is normal. No respiratory distress.  Abdominal:     General: Abdomen is protuberant. Bowel sounds are normal.     Palpations: Abdomen is soft.     Hernia: There is  no hernia in the umbilical area, left inguinal area or right inguinal area.  Musculoskeletal:     Cervical back: Normal range of motion.     Lumbar back: No spasms. Normal range of motion. Positive right straight leg raise test. Negative left straight leg raise test.  Skin:    General: Skin is warm and dry.     Capillary Refill: Capillary refill takes less than 2 seconds.     Findings: No rash.  Neurological:  General: No focal deficit present.     Mental Status: He is alert and oriented to person, place, and time.     Motor: Motor function is intact. No weakness.     Gait: Gait is intact.     Deep Tendon Reflexes:     Reflex Scores:      Patellar reflexes are 1+ on the right side and 2+ on the left side. Psychiatric:        Mood and Affect: Mood normal.        Behavior: Behavior normal.     Wt Readings from Last 3 Encounters:  05/05/23 212 lb (96.2 kg)  03/17/23 205 lb (93 kg)  01/30/23 205 lb (93 kg)    BP (!) 134/92   Pulse (!) 114   Ht 5\' 10"  (1.778 m)   Wt 212 lb (96.2 kg)   SpO2 99%   BMI 30.42 kg/m   Assessment and Plan:  Problem List Items Addressed This Visit   None Visit Diagnoses       Rectal pain    -  Primary   may be related to low back pain/sciatica continue topical  see GI - referred   Relevant Orders   Ambulatory referral to Gastroenterology     Acute left-sided low back pain with sciatica, sciatica laterality unspecified       He recently had a steroid taper will give Mobic and gabapentin x 30 days obtain imaging   Relevant Medications   meloxicam (MOBIC) 15 MG tablet   gabapentin (NEURONTIN) 100 MG capsule   Other Relevant Orders   DG Lumbar Spine Complete       No follow-ups on file.    Reubin Milan, MD [facility] [place]

## 2023-05-10 ENCOUNTER — Encounter: Payer: Self-pay | Admitting: Internal Medicine

## 2023-05-18 ENCOUNTER — Encounter: Payer: Self-pay | Admitting: Gastroenterology

## 2023-06-25 ENCOUNTER — Encounter: Payer: 59 | Admitting: Internal Medicine

## 2023-07-16 ENCOUNTER — Ambulatory Visit: Admitting: Gastroenterology

## 2023-07-16 ENCOUNTER — Encounter: Payer: Self-pay | Admitting: Gastroenterology

## 2023-07-16 VITALS — BP 136/86 | HR 91 | Ht 70.0 in | Wt 214.5 lb

## 2023-07-16 DIAGNOSIS — K6289 Other specified diseases of anus and rectum: Secondary | ICD-10-CM | POA: Diagnosis not present

## 2023-07-16 DIAGNOSIS — R109 Unspecified abdominal pain: Secondary | ICD-10-CM | POA: Diagnosis not present

## 2023-07-16 DIAGNOSIS — R1084 Generalized abdominal pain: Secondary | ICD-10-CM

## 2023-07-16 DIAGNOSIS — Z1211 Encounter for screening for malignant neoplasm of colon: Secondary | ICD-10-CM

## 2023-07-16 MED ORDER — HYDROCORTISONE ACETATE 25 MG RE SUPP: 25.0000 mg | Freq: Two times a day (BID) | RECTAL | 1 refills | Status: AC

## 2023-07-16 MED ORDER — NA SULFATE-K SULFATE-MG SULF 17.5-3.13-1.6 GM/177ML PO SOLN: 1.0000 | Freq: Once | ORAL | 0 refills | Status: AC

## 2023-07-16 MED ORDER — AMBULATORY NON FORMULARY MEDICATION: 1 refills | Status: AC

## 2023-07-16 NOTE — Patient Instructions (Signed)
 We have sent the following medications to your pharmacy for you to pick up at your convenience:  Anusol HC suppositories.  We have sent a prescription for nitroglycerin 0.125% gel to [facility]. You should apply a pea size amount to your rectum every 6-8 hours  [facility] information is below: Address: [address] [place], [place] [address]  [phone]  *Please DO NOT go directly from our office to pick up this medication! Give the pharmacy 1 day to process the prescription as this is compounded and takes time to make.   You have been scheduled for a colonoscopy. Please follow written instructions given to you at your visit today.   If you use inhalers (even only as needed), please bring them with you on the day of your procedure.  DO NOT TAKE 7 DAYS PRIOR TO TEST- Trulicity (dulaglutide) Ozempic, Wegovy (semaglutide) Mounjaro (tirzepatide) Bydureon Bcise (exanatide extended release)  DO NOT TAKE 1 DAY PRIOR TO YOUR TEST Rybelsus (semaglutide) Adlyxin (lixisenatide) Victoza (liraglutide) Byetta (exanatide) ___________________________________________________________________________

## 2023-07-16 NOTE — Progress Notes (Addendum)
 Chief Complaint: Rectal pain Primary GI MD: Para Bold  HPI: Nathan Humphrey is a 53 year old male who presents with rectal pain and abdominal discomfort.  CT abdomen pelvis with contrast 01/2023 for LUQ pain, nausea, vomiting - Wall thickening versus underdistention of gastric antrum (correlate for gastritis), hepatic steatosis  Labs 01/2023 with ALT 45, CBC with leukocytosis and normal lipase  Discussed the use of AI scribe software for clinical note transcription with the patient, who gave verbal consent to proceed.  History of Present Illness He has been experiencing rectal pain for the past few months. The pain occurs randomly, lasts for hours, and is not associated with bowel movements. Over-the-counter Excedrin provides some relief. 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 He notes this pain is also associated with pain through his genitally. All of this began after his vasectomy in the fall.  He recalls a single episode of sharp pain radiating from his chest to his stomach, associated with a specific movement while grabbing a child. This incident was unusual and not a common occurrence for him.  Previous evaluations indicated no significant findings, and a cream prescribed did not alleviate his symptoms. No changes in bowel habits are noted, with normal, formed stools. No chest pain, nausea, vomiting, weight loss, or changes in appetite.  He has undergone a CT scan and was informed that his labs were normal. He has not had a colonoscopy before. He underwent a vasectomy approximately three to four months ago and is unsure if this is related to his current symptoms. He experiences pain in the front, described as 'the whole thing,' which he associates with his back pain.  He has tried chiropractic care for his back pain, which he thought might be related to his symptoms, but it did not provide relief.  He reports feeling very uncomfortable and nervous when he  is at the doctor's office and this is difficult for him to talk about so history is difficult.    Past Medical History:  Diagnosis Date   Bilateral inguinal hernia    Bilateral inguinal hernia without obstruction or gangrene 07/24/2016   Bright red rectal bleeding 07/24/2016   Chronic kidney disease    kidney stones   Hyperlipidemia, mixed 08/03/2016   10 yr risk 7.5 %   Kidney stones 07/24/2016   Has had several episodes of stones   Migraines    Pulmonary nodules/lesions, multiple 07/24/2016   CT scan in one year to follow up 2 new nodules in Lingula (5 mm in size)   Tobacco use disorder 07/24/2016    Past Surgical History:  Procedure Laterality Date   VASECTOMY      Current Outpatient Medications  Medication Sig Dispense Refill   AMBULATORY NON FORMULARY MEDICATION Medication Name: Nitroglycerin 0.125%;  Use a pea sized amount every 6-8 hours 30 g 1   Aspirin-Acetaminophen -Caffeine (EXCEDRIN PO) Take by mouth as needed.     betamethasone  valerate ointment (VALISONE ) 0.1 % Apply 1 Application topically 2 (two) times daily. To eczematous lesions on legs 45 g 1   gabapentin  (NEURONTIN ) 100 MG capsule Take 1 capsule (100 mg total) by mouth at bedtime. 30 capsule 0   hydrocortisone  (ANUSOL -HC) 2.5 % rectal cream Apply 1 Application topically 2 (two) times daily.     hydrocortisone  (ANUSOL -HC) 25 MG suppository Place 1 suppository (25 mg total) rectally 2 (two) times daily. 12 suppository 1   meloxicam  (MOBIC ) 15 MG tablet Take 1 tablet (  15 mg total) by mouth daily. 30 tablet 0   Na Sulfate-K Sulfate-Mg Sulfate concentrate (SUPREP) 17.5-3.13-1.6 GM/177ML SOLN Take 1 kit (354 mLs total) by mouth once for 1 dose. 354 mL 0   albuterol (VENTOLIN HFA) 108 (90 Base) MCG/ACT inhaler Inhale 2 puffs into the lungs every 4 (four) hours as needed. (Patient not taking: Reported on 07/16/2023)     atorvastatin  (LIPITOR) 10 MG tablet Take 1 tablet (10 mg total) by mouth daily. (Patient not taking:  Reported on 07/16/2023) 90 tablet 1   No current facility-administered medications for this visit.    Allergies as of 07/16/2023   (No Known Allergies)    Family History  Problem Relation Age of Onset   CAD Mother 20    Social History   Socioeconomic History   Marital status: Married    Spouse name: Not on file   Number of children: Not on file   Years of education: Not on file   Highest education level: Not on file  Occupational History   Not on file  Tobacco Use   Smoking status: Some Days    Types: Cigars   Smokeless tobacco: Never   Tobacco comments:    2-3 cigars per day only on weekends (inhaled)  Vaping Use   Vaping status: Former  Substance and Sexual Activity   Alcohol use: Yes    Comment: occasional.   Drug use: No   Sexual activity: Yes    Partners: Female  Other Topics Concern   Not on file  Social History Narrative   Not on file   Social Drivers of Health   Financial Resource Strain: Low Risk  (03/03/2022)   Overall Financial Resource Strain (CARDIA)    Difficulty of Paying Living Expenses: Not hard at all  Food Insecurity: No Food Insecurity (05/05/2023)   Hunger Vital Sign    Worried About Running Out of Food in the Last Year: Never true    Ran Out of Food in the Last Year: Never true  Transportation Needs: No Transportation Needs (05/05/2023)   PRAPARE - Administrator, Civil Service (Medical): No    Lack of Transportation (Non-Medical): No  Physical Activity: Not on file  Stress: Not on file  Social Connections: Unknown (06/22/2021)   Received from [facility]   Social Network    Social Network: Not on file  Intimate Partner Violence: Not At Risk (05/05/2023)   Humiliation, Afraid, Rape, and Kick questionnaire    Fear of Current or Ex-Partner: No    Emotionally Abused: No    Physically Abused: No    Sexually Abused: No    Review of Systems:    Constitutional: No weight loss, fever, chills, weakness or  fatigue HEENT: Eyes: No change in vision               Ears, Nose, Throat:  No change in hearing or congestion Skin: No rash or itching Cardiovascular: No chest pain, chest pressure or palpitations   Respiratory: No SOB or cough Gastrointestinal: See HPI and otherwise negative Genitourinary: No dysuria or change in urinary frequency Neurological: No headache, dizziness or syncope Musculoskeletal: No new muscle or joint pain Hematologic: No bleeding or bruising Psychiatric: No history of depression or anxiety    Physical Exam:  Vital signs: BP 136/86   Pulse 91   Ht 5\' 10"  (1.778 m)   Wt 214 lb 8 oz (97.3 kg)   SpO2 100%   BMI 30.78 kg/m  Constitutional: NAD, alert and cooperative Head:  Normocephalic and atraumatic. Eyes:   PEERL, EOMI. No icterus. Conjunctiva pink. Respiratory: Respirations even and unlabored. Lungs clear to auscultation bilaterally.   No wheezes, crackles, or rhonchi.  Cardiovascular:  Regular rate and rhythm. No peripheral edema, cyanosis or pallor.  Gastrointestinal:  Soft, nondistended, nontender. No rebound or guarding. Normal bowel sounds. No appreciable masses or hepatomegaly. Rectal:  Declines Msk:  Symmetrical without gross deformities. Without edema, no deformity or joint abnormality.  Neurologic:  Alert and  oriented x4;  grossly normal neurologically.  Skin:   Dry and intact without significant lesions or rashes. Psychiatric: Oriented to person, place and time. Demonstrates good judgement and reason without abnormal affect or behaviors.  RELEVANT LABS AND IMAGING: CBC    Component Value Date/Time   WBC 15.4 (H) 01/30/2023 0816   RBC 5.60 01/30/2023 0816   HGB 17.7 (H) 01/30/2023 0816   HGB 17.6 03/03/2022 0930   HCT 50.5 01/30/2023 0816   HCT 52.5 (H) 03/03/2022 0930   PLT 214 01/30/2023 0816   PLT 196 03/03/2022 0930   MCV 90.2 01/30/2023 0816   MCV 92 03/03/2022 0930   MCH 31.6 01/30/2023 0816   MCHC 35.0 01/30/2023 0816   RDW 12.4  01/30/2023 0816   RDW 12.4 03/03/2022 0930   LYMPHSABS 2.2 01/30/2023 0816   LYMPHSABS 2.3 03/03/2022 0930   MONOABS 1.3 (H) 01/30/2023 0816   EOSABS 0.1 01/30/2023 0816   EOSABS 0.2 03/03/2022 0930   BASOSABS 0.1 01/30/2023 0816   BASOSABS 0.1 03/03/2022 0930    CMP     Component Value Date/Time   NA 140 01/30/2023 0816   NA 141 08/14/2022 1517   K 4.2 01/30/2023 0816   CL 96 (L) 01/30/2023 0816   CO2 35 (H) 01/30/2023 0816   GLUCOSE 159 (H) 01/30/2023 0816   BUN 16 01/30/2023 0816   BUN 14 08/14/2022 1517   CREATININE 1.32 (H) 01/30/2023 0816   CALCIUM  11.0 (H) 01/30/2023 0816   PROT 8.0 01/30/2023 0816   PROT 7.1 08/14/2022 1517   ALBUMIN 4.7 01/30/2023 0816   ALBUMIN 4.7 08/14/2022 1517   AST 34 01/30/2023 0816   ALT 45 (H) 01/30/2023 0816   ALKPHOS 110 01/30/2023 0816   BILITOT 1.1 01/30/2023 0816   BILITOT 0.7 08/14/2022 1517   GFRNONAA >60 01/30/2023 0816   GFRAA 75 02/21/2020 0842     Assessment/Plan:   Assessment & Plan  Rectal pain Persistent rectal pain for 2-4 months months, occurs a few times a week. Random and lasts for hours. Associated with pain radiating through gentially and down back of legs. Began after his vasectomy in the fall. No pain with urination or ejaculation. Not associated with bowel movements. 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 Patient reports feeling uncomfortable as he doesn't like doctor visits, rectal exam deferred to colonoscopy. Normal CT scan. Multiple DDX's possible including nerve pain post vasectomy, pelvic floor, internal hemorrhoids, proctalgia fugrax. Very difficult history to obtain clearly. Reassuringly no change in bowels, weight loss, or family history of colon cancer.  - schedule colonoscopy - discussed risks versus benefits - Given the severity and frequency of of symptoms, I am recommending topical therapy NTG 1.25% use gloved hand, apply pea size amount every 6-8 hours for pain rectally,  #30 gram no refills. - trial of hydrocortisone  suppositories (if internal hemorrhoids) - if unrevealing colonoscopy and no improvement with above, would recommend referring back to urology with presence of "  frontal pain" - If the patient is refractory to initial management or has a fever +/- leukocytosis, I recommend an endoanal ultrasound or magnetic resonance imaging of the abdomen and pelvis.   Abdominal pain Occasional abdominal pain that is worse with movement. Negative CT scan. Likely musculoskeletal in nature.  Patient requests male MD for his colonoscopy  Gigi Kyle [facility] 07/16/2023, 3:04 PM  Cc: Sheron Dixons, MD  I have reviewed the clinic note as outlined by Suzanna Erp, PA and agree with the assessment, plan and medical decision making.  Nathan Humphrey presents for evaluation and management of rectal pain over the last 2 to 4 months the onset of which correlated with after his vasectomy.  Endorses fullness in the rectum.  No report of rectal bleeding.  CT scan has not shown pathology in this area.  He is due for a screening colonoscopy which will also permit for evaluation of his rectal discomfort.  In the short-term, a prescription for topical nitroglycerin has been provided in the event there is a component of fissure/spasm.  Eugenia Hess, MD

## 2023-08-16 NOTE — Progress Notes (Unsigned)
  Gastroenterology History and Physical   Primary Care Physician:  Justus Leita DEL, MD   Reason for Procedure:  Rectal pain and fullness  Plan:    Colonoscopy     HPI: Nathan Humphrey is a 53 y.o. male undergoing colonoscopy for investigation of rectal pain and fullness.  Patient reports rectal discomfort over the last 3 to 4 months that started after vasectomy.  No rectal bleeding or change in bowel habits.  CT scan of the abdomen and pelvis showed possible thickening of the gastric antrum but no abnormalities in the anorectal region or colon.  He is due for a screening colonoscopy as he has not had one before.  No documented family history of colorectal cancer or polyps.   Past Medical History:  Diagnosis Date   Bilateral inguinal hernia    Bilateral inguinal hernia without obstruction or gangrene 07/24/2016   Bright red rectal bleeding 07/24/2016   Chronic kidney disease    kidney stones   Hyperlipidemia, mixed 08/03/2016   10 yr risk 7.5 %   Kidney stones 07/24/2016   Has had several episodes of stones   Migraines    Pulmonary nodules/lesions, multiple 07/24/2016   CT scan in one year to follow up 2 new nodules in Lingula (5 mm in size)   Tobacco use disorder 07/24/2016    Past Surgical History:  Procedure Laterality Date   VASECTOMY      Prior to Admission medications   Medication Sig Start Date End Date Taking? Authorizing Provider  albuterol (VENTOLIN HFA) 108 (90 Base) MCG/ACT inhaler Inhale 2 puffs into the lungs every 4 (four) hours as needed. Patient not taking: Reported on 07/16/2023 04/12/23   [provider]  AMBULATORY NON FORMULARY MEDICATION Medication Name: Nitroglycerin 0.125%;  Use a pea sized amount every 6-8 hours 07/16/23   Mollie Pfeiffer M, PA-C  Aspirin-Acetaminophen -Caffeine (EXCEDRIN PO) Take by mouth as needed.    [provider]  atorvastatin  (LIPITOR) 10 MG tablet Take 1 tablet (10 mg total) by mouth daily. Patient not taking:  Reported on 07/16/2023 03/17/23   Justus Leita DEL, MD  betamethasone  valerate ointment (VALISONE ) 0.1 % Apply 1 Application topically 2 (two) times daily. To eczematous lesions on legs 03/17/23   Justus Leita DEL, MD  gabapentin  (NEURONTIN ) 100 MG capsule Take 1 capsule (100 mg total) by mouth at bedtime. 05/05/23   Justus Leita DEL, MD  hydrocortisone  (ANUSOL -HC) 2.5 % rectal cream Apply 1 Application topically 2 (two) times daily. 04/12/23   [provider]  hydrocortisone  (ANUSOL -HC) 25 MG suppository Place 1 suppository (25 mg total) rectally 2 (two) times daily. 07/16/23   McMichael, Pfeiffer HERO, PA-C  meloxicam  (MOBIC ) 15 MG tablet Take 1 tablet (15 mg total) by mouth daily. 05/05/23   Justus Leita DEL, MD    Current Outpatient Medications  Medication Sig Dispense Refill   albuterol (VENTOLIN HFA) 108 (90 Base) MCG/ACT inhaler Inhale 2 puffs into the lungs every 4 (four) hours as needed. (Patient not taking: Reported on 07/16/2023)     AMBULATORY NON FORMULARY MEDICATION Medication Name: Nitroglycerin 0.125%;  Use a pea sized amount every 6-8 hours 30 g 1   Aspirin-Acetaminophen -Caffeine (EXCEDRIN PO) Take by mouth as needed.     atorvastatin  (LIPITOR) 10 MG tablet Take 1 tablet (10 mg total) by mouth daily. (Patient not taking: Reported on 08/18/2023) 90 tablet 1   betamethasone  valerate ointment (VALISONE ) 0.1 % Apply 1 Application topically 2 (two) times daily. To eczematous lesions on legs  45 g 1   gabapentin  (NEURONTIN ) 100 MG capsule Take 1 capsule (100 mg total) by mouth at bedtime. 30 capsule 0   hydrocortisone  (ANUSOL -HC) 2.5 % rectal cream Apply 1 Application topically 2 (two) times daily.     hydrocortisone  (ANUSOL -HC) 25 MG suppository Place 1 suppository (25 mg total) rectally 2 (two) times daily. 12 suppository 1   meloxicam  (MOBIC ) 15 MG tablet Take 1 tablet (15 mg total) by mouth daily. 30 tablet 0   Current Facility-Administered Medications  Medication Dose Route Frequency  Provider Last Rate Last Admin   0.9 %  sodium chloride  infusion  500 mL Intravenous Once Maison Agrusa, Inocente HERO, MD        Allergies as of 08/18/2023   (No Known Allergies)    Family History  Problem Relation Age of Onset   CAD Mother 36    Social History   Socioeconomic History   Marital status: Married    Spouse name: Not on file   Number of children: Not on file   Years of education: Not on file   Highest education level: Not on file  Occupational History   Not on file  Tobacco Use   Smoking status: Some Days    Types: Cigars   Smokeless tobacco: Never   Tobacco comments:    2-3 cigars per day only on weekends (inhaled)  Vaping Use   Vaping status: Former  Substance and Sexual Activity   Alcohol use: Yes    Comment: occasional.   Drug use: No   Sexual activity: Yes    Partners: Female  Other Topics Concern   Not on file  Social History Narrative   Not on file   Social Drivers of Health   Financial Resource Strain: Low Risk  (03/03/2022)   Overall Financial Resource Strain (CARDIA)    Difficulty of Paying Living Expenses: Not hard at all  Food Insecurity: No Food Insecurity (05/05/2023)   Hunger Vital Sign    Worried About Running Out of Food in the Last Year: Never true    Ran Out of Food in the Last Year: Never true  Transportation Needs: No Transportation Needs (05/05/2023)   PRAPARE - Administrator, Civil Service (Medical): No    Lack of Transportation (Non-Medical): No  Physical Activity: Not on file  Stress: Not on file  Social Connections: Unknown (06/22/2021)   Received [facility]   Social Network    Social Network: Not on file  Intimate Partner Violence: Not At Risk (05/05/2023)   Humiliation, Afraid, Rape, and Kick questionnaire    Fear of Current or Ex-Partner: No    Emotionally Abused: No    Physically Abused: No    Sexually Abused: No    Review of Systems:  All other review of systems negative except as mentioned in the  HPI.  Physical Exam: Vital signs BP 135/85   Pulse 78   Temp 98.1 F (36.7 C)   Ht 5' 10 (1.778 m)   Wt 214 lb (97.1 kg)   SpO2 99%   BMI 30.71 kg/m   General:   Alert,  Well-developed, well-nourished, pleasant and cooperative in NAD Airway:  Mallampati 2 Lungs:  Clear throughout to auscultation.   Heart:  Regular rate and rhythm; no murmurs, clicks, rubs,  or gallops. Abdomen:  Soft, nontender and nondistended. Normal bowel sounds.   Neuro/Psych:  Normal mood and affect. A and O x 3  Inocente Hausen, MD [facility]

## 2023-08-18 ENCOUNTER — Ambulatory Visit: Admitting: Pediatrics

## 2023-08-18 ENCOUNTER — Encounter: Payer: Self-pay | Admitting: Pediatrics

## 2023-08-18 VITALS — BP 110/78 | HR 71 | Temp 98.1°F | Resp 13 | Ht 70.0 in | Wt 214.0 lb

## 2023-08-18 DIAGNOSIS — D128 Benign neoplasm of rectum: Secondary | ICD-10-CM

## 2023-08-18 DIAGNOSIS — Z1211 Encounter for screening for malignant neoplasm of colon: Secondary | ICD-10-CM | POA: Diagnosis present

## 2023-08-18 DIAGNOSIS — K648 Other hemorrhoids: Secondary | ICD-10-CM | POA: Diagnosis not present

## 2023-08-18 DIAGNOSIS — K635 Polyp of colon: Secondary | ICD-10-CM

## 2023-08-18 DIAGNOSIS — D122 Benign neoplasm of ascending colon: Secondary | ICD-10-CM

## 2023-08-18 DIAGNOSIS — D124 Benign neoplasm of descending colon: Secondary | ICD-10-CM

## 2023-08-18 DIAGNOSIS — D125 Benign neoplasm of sigmoid colon: Secondary | ICD-10-CM

## 2023-08-18 DIAGNOSIS — D120 Benign neoplasm of cecum: Secondary | ICD-10-CM

## 2023-08-18 DIAGNOSIS — D123 Benign neoplasm of transverse colon: Secondary | ICD-10-CM

## 2023-08-18 MED ORDER — SODIUM CHLORIDE 0.9 % IV SOLN: 500.0000 mL | Freq: Once | INTRAVENOUS | Status: DC

## 2023-08-18 NOTE — Op Note (Signed)
 [facility] Patient Name: Nathan Humphrey Procedure Date: 08/18/2023 1:07 PM MRN: [id number] Endoscopist: Inocente Hausen , MD, [id number] Age: 53 Referring MD:  Date of Birth: 08/10/1970 Gender: Male Account #: [id number] Procedure:                Colonoscopy Indications:              Screening for colorectal malignant neoplasm, This                            is the patient's first colonoscopy; Incidental                            notation of rectal pain and fullness Procedure:                Pre-Anesthesia Assessment:                           - Prior to the procedure, a History and Physical                            was performed, and patient medications and                            allergies were reviewed. The patient's tolerance of                            previous anesthesia was also reviewed. The risks                            and benefits of the procedure and the sedation                            options and risks were discussed with the patient.                            All questions were answered, and informed consent                            was obtained. Prior Anticoagulants: The patient has                            taken no anticoagulant or antiplatelet agents. ASA                            Grade Assessment: II - A patient with mild systemic                            disease. After reviewing the risks and benefits,                            the patient was deemed in satisfactory condition to                            undergo the procedure.  After obtaining informed consent, the colonoscope                            was passed under direct vision. Throughout the                            procedure, the patient's blood pressure, pulse, and                            oxygen saturations were monitored continuously. The                            CF [id number] #[id number] was introduced through the anus                            and  advanced to the cecum, identified by                            appendiceal orifice and ileocecal valve. The                            colonoscopy was performed without difficulty. The                            patient tolerated the procedure well. The quality                            of the bowel preparation was good. The ileocecal                            valve, appendiceal orifice, and rectum were                            photographed. Scope In: 1:20:49 PM Scope Out: 1:47:09 PM Scope Withdrawal Time: 0 hours 20 minutes 19 seconds  Total Procedure Duration: 0 hours 26 minutes 20 seconds  Findings:                 The perianal and digital rectal examinations were                            normal. Pertinent negatives include normal                            sphincter tone and no palpable rectal lesions.                           Two sessile polyps were found in the ileocecal                            valve. The polyps were 4 to 5 mm in size. These                            polyps were removed with a cold biopsy forceps.  Resection and retrieval were complete.                           A 3 mm polyp was found in the ascending colon. The                            polyp was sessile. The polyp was removed with a                            cold biopsy forceps. Resection and retrieval were                            complete.                           A 6 mm polyp was found in the transverse colon. The                            polyp was sessile. The polyp was removed with a                            cold snare. Resection and retrieval were complete.                           A 3 mm polyp was found in the transverse colon. The                            polyp was sessile. The polyp was removed with a                            cold biopsy forceps. Resection and retrieval were                            complete.                           A 4 mm polyp was  found in the descending colon. The                            polyp was sessile. The polyp was removed with a                            cold biopsy forceps. Resection and retrieval were                            complete.                           Two sessile polyps were found in the sigmoid colon.                            The polyps were 5 to 6 mm in size. These polyps  were removed with a cold snare. Resection and                            retrieval were complete.                           Three sessile polyps were found in the rectum. The                            polyps were 3 to 4 mm in size. These polyps were                            removed with a cold biopsy forceps. Resection and                            retrieval were complete.                           Internal hemorrhoids were found during retroflexion.                           No pathologic findings identified to explain                            patient's description of rectal pain and fullness Complications:            No immediate complications. Estimated blood loss:                            Minimal. Estimated Blood Loss:     Estimated blood loss was minimal. Impression:               - Two 4 to 5 mm polyps at the ileocecal valve,                            removed with a cold biopsy forceps. Resected and                            retrieved.                           - One 3 mm polyp in the ascending colon, removed                            with a cold biopsy forceps. Resected and retrieved.                           - One 6 mm polyp in the transverse colon, removed                            with a cold snare. Resected and retrieved.                           - One 3 mm polyp in the transverse colon, removed  with a cold biopsy forceps. Resected and retrieved.                           - One 4 mm polyp in the descending colon, removed                             with a cold biopsy forceps. Resected and retrieved.                           - Two 5 to 6 mm polyps in the sigmoid colon,                            removed with a cold snare. Resected and retrieved.                           - Three 3 to 4 mm polyps in the rectum, removed                            with a cold biopsy forceps. Resected and retrieved.                           - Internal hemorrhoids.                           - No pathologic findings identified to explain                            patient's description of rectal pain and fullness Recommendation:           - Discharge patient to home (ambulatory).                           - Await pathology results.                           - Repeat colonoscopy for surveillance based on                            pathology results.                           - The findings and recommendations were discussed                            with the patient's family.                           - Patient has a contact number available for                            emergencies. The signs and symptoms of potential                            delayed complications were discussed with the  patient. Return to normal activities tomorrow.                            Written discharge instructions were provided to the                            patient. Inocente Hausen, MD 08/18/2023 1:54:58 PM This report has been signed electronically.

## 2023-08-18 NOTE — Patient Instructions (Signed)
YOU HAD AN ENDOSCOPIC PROCEDURE TODAY AT THE [facility]:   Refer to the procedure report that was given to you for any specific questions about what was found during the examination.  If the procedure report does not answer your questions, please call your gastroenterologist to clarify.  If you requested that your care partner not be given the details of your procedure findings, then the procedure report has been included in a sealed envelope for you to review at your convenience later.  **Handouts given on polyps and hemorrhoids**  YOU SHOULD EXPECT: Some feelings of bloating in the abdomen. Passage of more gas than usual.  Walking can help get rid of the air that was put into your GI tract during the procedure and reduce the bloating. If you had a lower endoscopy (such as a colonoscopy or flexible sigmoidoscopy) you may notice spotting of blood in your stool or on the toilet paper. If you underwent a bowel prep for your procedure, you may not have a normal bowel movement for a few days.  Please Note:  You might notice some irritation and congestion in your nose or some drainage.  This is from the oxygen used during your procedure.  There is no need for concern and it should clear up in a day or so.  SYMPTOMS TO REPORT IMMEDIATELY:  Following lower endoscopy (colonoscopy or flexible sigmoidoscopy):  Excessive amounts of blood in the stool  Significant tenderness or worsening of abdominal pains  Swelling of the abdomen that is new, acute  Fever of 100F or higher  For urgent or emergent issues, a gastroenterologist can be reached at any hour by calling [phone]. Do not use MyChart messaging for urgent concerns.    DIET:  We do recommend a small meal at first, but then you may proceed to your regular diet.  Drink plenty of fluids but you should avoid alcoholic beverages for 24 hours.  ACTIVITY:  You should plan to take it easy for the rest of today and you should NOT DRIVE or  use heavy machinery until tomorrow (because of the sedation medicines used during the test).    FOLLOW UP: Our staff will call the number listed on your records the next business day following your procedure.  We will call around 7:15- 8:00 am to check on you and address any questions or concerns that you may have regarding the information given to you following your procedure. If we do not reach you, we will leave a message.     If any biopsies were taken you will be contacted by phone or by letter within the next 1-3 weeks.  Please call us at [phone] if you have not heard about the biopsies in 3 weeks.    SIGNATURES/CONFIDENTIALITY: You and/or your care partner have signed paperwork which will be entered into your electronic medical record.  These signatures attest to the fact that that the information above on your After Visit Summary has been reviewed and is understood.  Full responsibility of the confidentiality of this discharge information lies with you and/or your care-partner.  

## 2023-08-18 NOTE — [facility] Notes (Signed)
 Vss nad trans to pacu

## 2023-08-18 NOTE — [facility] Notes (Signed)
 Called to room to assist during endoscopic procedure.  Patient ID and intended procedure confirmed with present staff. Received instructions for my participation in the procedure from the performing physician.

## 2023-08-18 NOTE — [facility] Notes (Signed)
 1330:  placed oral airway #9 due to osa

## 2023-08-18 NOTE — [facility] Notes (Signed)
 Pt's states no medical or surgical changes since previsit or office visit.

## 2023-08-19 ENCOUNTER — Telehealth: Payer: Self-pay

## 2023-08-19 NOTE — Telephone Encounter (Signed)
  Follow up Call-     08/18/2023   12:56 PM  Call back number  Post procedure Call Back phone  # [phone]  Permission to leave phone message Yes     Patient questions:  Do you have a fever, pain , or abdominal swelling? No. Pain Score  0 *  Have you tolerated food without any problems? Yes.    Have you been able to return to your normal activities? Yes.    Do you have any questions about your discharge instructions: Diet   No. Medications  No. Follow up visit  No.  Do you have questions or concerns about your Care? No.  Actions: * If pain score is 4 or above: No action needed, pain <4.

## 2023-08-23 ENCOUNTER — Ambulatory Visit: Payer: Self-pay | Admitting: Pediatrics

## 2023-08-23 LAB — SURGICAL PATHOLOGY
